# Patient Record
Sex: Male | Born: 1960 | Race: White | Hispanic: No | Marital: Single | Smoking: Current every day smoker
Health system: Southern US, Community
[De-identification: ages and names within clinical notes are randomized; demographics above are authoritative.]

## PROBLEM LIST (undated history)

## (undated) DIAGNOSIS — F1010 Alcohol abuse, uncomplicated: Secondary | ICD-10-CM

## (undated) DIAGNOSIS — M79606 Pain in leg, unspecified: Secondary | ICD-10-CM

## (undated) DIAGNOSIS — R0989 Other specified symptoms and signs involving the circulatory and respiratory systems: Secondary | ICD-10-CM

## (undated) DIAGNOSIS — G8929 Other chronic pain: Secondary | ICD-10-CM

## (undated) DIAGNOSIS — Z5900 Homelessness unspecified: Secondary | ICD-10-CM

## (undated) DIAGNOSIS — M549 Dorsalgia, unspecified: Secondary | ICD-10-CM

## (undated) HISTORY — PX: BACK SURGERY: SHX140

---

## 1996-03-19 HISTORY — PX: OTHER SURGICAL HISTORY: SHX169

## 2001-05-02 ENCOUNTER — Emergency Department (HOSPITAL_COMMUNITY): Admission: EM | Admit: 2001-05-02 | Discharge: 2001-05-02 | Payer: Self-pay | Admitting: *Deleted

## 2001-05-02 ENCOUNTER — Encounter: Payer: Self-pay | Admitting: *Deleted

## 2001-12-13 ENCOUNTER — Emergency Department (HOSPITAL_COMMUNITY): Admission: EM | Admit: 2001-12-13 | Discharge: 2001-12-14 | Payer: Self-pay | Admitting: Internal Medicine

## 2001-12-14 ENCOUNTER — Encounter: Payer: Self-pay | Admitting: Internal Medicine

## 2005-08-19 ENCOUNTER — Emergency Department (HOSPITAL_COMMUNITY): Admission: EM | Admit: 2005-08-19 | Discharge: 2005-08-19 | Payer: Self-pay | Admitting: Emergency Medicine

## 2005-08-28 ENCOUNTER — Emergency Department (HOSPITAL_COMMUNITY): Admission: EM | Admit: 2005-08-28 | Discharge: 2005-08-29 | Payer: Self-pay | Admitting: Emergency Medicine

## 2008-11-01 ENCOUNTER — Emergency Department (HOSPITAL_COMMUNITY): Admission: EM | Admit: 2008-11-01 | Discharge: 2008-11-01 | Payer: Self-pay | Admitting: Emergency Medicine

## 2008-11-02 ENCOUNTER — Emergency Department (HOSPITAL_COMMUNITY): Admission: EM | Admit: 2008-11-02 | Discharge: 2008-11-02 | Payer: Self-pay | Admitting: Emergency Medicine

## 2009-04-03 ENCOUNTER — Emergency Department (HOSPITAL_COMMUNITY): Admission: EM | Admit: 2009-04-03 | Discharge: 2009-04-03 | Payer: Self-pay | Admitting: Emergency Medicine

## 2009-04-08 ENCOUNTER — Emergency Department (HOSPITAL_COMMUNITY): Admission: EM | Admit: 2009-04-08 | Discharge: 2009-04-08 | Payer: Self-pay | Admitting: Emergency Medicine

## 2009-04-19 ENCOUNTER — Ambulatory Visit (HOSPITAL_COMMUNITY)
Admission: RE | Admit: 2009-04-19 | Discharge: 2009-04-19 | Payer: Self-pay | Admitting: Physical Medicine and Rehabilitation

## 2009-05-06 ENCOUNTER — Inpatient Hospital Stay (HOSPITAL_COMMUNITY)
Admission: RE | Admit: 2009-05-06 | Discharge: 2009-05-07 | Payer: Self-pay | Source: Home / Self Care | Admitting: Neurosurgery

## 2009-05-10 ENCOUNTER — Emergency Department (HOSPITAL_COMMUNITY): Admission: EM | Admit: 2009-05-10 | Discharge: 2009-05-10 | Payer: Self-pay | Admitting: Emergency Medicine

## 2009-06-09 ENCOUNTER — Encounter (HOSPITAL_COMMUNITY): Admission: RE | Admit: 2009-06-09 | Discharge: 2009-07-09 | Payer: Self-pay | Admitting: Neurosurgery

## 2009-06-14 ENCOUNTER — Emergency Department (HOSPITAL_COMMUNITY): Admission: EM | Admit: 2009-06-14 | Discharge: 2009-06-14 | Payer: Self-pay | Admitting: Emergency Medicine

## 2009-06-18 ENCOUNTER — Emergency Department (HOSPITAL_COMMUNITY): Admission: EM | Admit: 2009-06-18 | Discharge: 2009-06-19 | Payer: Self-pay | Admitting: Emergency Medicine

## 2009-06-25 ENCOUNTER — Emergency Department (HOSPITAL_COMMUNITY): Admission: EM | Admit: 2009-06-25 | Discharge: 2009-06-25 | Payer: Self-pay | Admitting: Emergency Medicine

## 2009-07-12 ENCOUNTER — Encounter (HOSPITAL_COMMUNITY)
Admission: RE | Admit: 2009-07-12 | Discharge: 2009-08-11 | Payer: Self-pay | Source: Home / Self Care | Admitting: Neurosurgery

## 2010-03-19 ENCOUNTER — Emergency Department (HOSPITAL_COMMUNITY)
Admission: EM | Admit: 2010-03-19 | Discharge: 2010-03-20 | Payer: Self-pay | Source: Home / Self Care | Admitting: Emergency Medicine

## 2010-03-27 ENCOUNTER — Emergency Department (HOSPITAL_COMMUNITY)
Admission: EM | Admit: 2010-03-27 | Discharge: 2010-03-27 | Payer: Self-pay | Source: Home / Self Care | Admitting: Emergency Medicine

## 2010-05-11 ENCOUNTER — Emergency Department (HOSPITAL_COMMUNITY)
Admission: EM | Admit: 2010-05-11 | Discharge: 2010-05-11 | Disposition: A | Discharge status: Home / Self Care | Payer: Self-pay | Attending: Emergency Medicine | Admitting: Emergency Medicine

## 2010-05-11 ENCOUNTER — Emergency Department (HOSPITAL_COMMUNITY): Payer: Self-pay

## 2010-05-11 DIAGNOSIS — F1010 Alcohol abuse, uncomplicated: Secondary | ICD-10-CM | POA: Insufficient documentation

## 2010-05-11 DIAGNOSIS — R4182 Altered mental status, unspecified: Secondary | ICD-10-CM | POA: Insufficient documentation

## 2010-05-11 LAB — CBC
HCT: 46.8 % (ref 39.0–52.0)
Hemoglobin: 16 g/dL (ref 13.0–17.0)
MCH: 33 pg (ref 26.0–34.0)
MCV: 96.5 fL (ref 78.0–100.0)

## 2010-05-11 LAB — COMPREHENSIVE METABOLIC PANEL
AST: 22 U/L (ref 0–37)
BUN: 10 mg/dL (ref 6–23)
CO2: 25 mEq/L (ref 19–32)
Chloride: 110 mEq/L (ref 96–112)
Creatinine, Ser: 1.1 mg/dL (ref 0.4–1.5)
GFR calc Af Amer: >60 mL/min (ref >60)
GFR calc non Af Amer: >60 mL/min (ref >60)
Glucose, Bld: 118 mg/dL — ABNORMAL HIGH (ref 70–99)
Sodium: 145 mEq/L (ref 135–145)
Total Bilirubin: 0.4 mg/dL (ref 0.3–1.2)

## 2010-05-11 LAB — POCT CARDIAC MARKERS
Myoglobin, poc: 95.4 ng/mL (ref 12–200)
Troponin i, poc: <0.05 ng/mL (ref 0.00–0.09)

## 2010-05-11 LAB — DIFFERENTIAL
Basophils Absolute: 0 10*3/uL (ref 0.0–0.1)
Eosinophils Relative: 2 % (ref 0–5)
Lymphocytes Relative: 20 % (ref 12–46)
Monocytes Relative: 7 % (ref 3–12)
Neutro Abs: 9.7 10*3/uL — ABNORMAL HIGH (ref 1.7–7.7)

## 2010-05-11 LAB — RAPID URINE DRUG SCREEN, HOSP PERFORMED
Amphetamines: NOT DETECTED
Barbiturates: NOT DETECTED
Opiates: NOT DETECTED

## 2010-05-11 LAB — CK TOTAL AND CKMB (NOT AT ARMC): CK, MB: 2.6 ng/mL (ref 0.3–4.0)

## 2010-05-15 LAB — POCT CARDIAC MARKERS
CKMB, poc: 1.3 ng/mL (ref 1.0–8.0)
Myoglobin, poc: 244 ng/mL (ref 12–200)
Myoglobin, poc: 67.6 ng/mL (ref 12–200)
Troponin i, poc: <0.05 ng/mL (ref 0.00–0.09)
Troponin i, poc: <0.05 ng/mL (ref 0.00–0.09)

## 2010-06-07 LAB — DIFFERENTIAL
Basophils Absolute: 0 10*3/uL (ref 0.0–0.1)
Basophils Relative: 0 % (ref 0–1)
Lymphocytes Relative: 19 % (ref 12–46)
Monocytes Absolute: 0.7 10*3/uL (ref 0.1–1.0)
Neutro Abs: 7.1 10*3/uL (ref 1.7–7.7)
Neutrophils Relative %: 72 % (ref 43–77)

## 2010-06-07 LAB — POCT CARDIAC MARKERS
CKMB, poc: 2.7 ng/mL (ref 1.0–8.0)
Myoglobin, poc: 89.6 ng/mL (ref 12–200)

## 2010-06-07 LAB — COMPREHENSIVE METABOLIC PANEL
Albumin: 3.3 g/dL — ABNORMAL LOW (ref 3.5–5.2)
Alkaline Phosphatase: 110 U/L (ref 39–117)
BUN: 4 mg/dL — ABNORMAL LOW (ref 6–23)
Chloride: 103 mEq/L (ref 96–112)
Creatinine, Ser: 0.78 mg/dL (ref 0.4–1.5)
Glucose, Bld: 117 mg/dL — ABNORMAL HIGH (ref 70–99)
Total Bilirubin: 0.2 mg/dL — ABNORMAL LOW (ref 0.3–1.2)

## 2010-06-07 LAB — CBC
HCT: 38.7 % — ABNORMAL LOW (ref 39.0–52.0)
Hemoglobin: 13.4 g/dL (ref 13.0–17.0)
MCV: 97.3 fL (ref 78.0–100.0)
Platelets: 358 10*3/uL (ref 150–400)
RDW: 13.4 % (ref 11.5–15.5)
WBC: 9.9 10*3/uL (ref 4.0–10.5)

## 2010-06-07 LAB — RAPID URINE DRUG SCREEN, HOSP PERFORMED
Amphetamines: NOT DETECTED
Tetrahydrocannabinol: NOT DETECTED

## 2010-06-07 LAB — AMMONIA: Ammonia: 17 umol/L (ref 11–35)

## 2010-06-08 LAB — CBC
HCT: 42.9 % (ref 39.0–52.0)
Hemoglobin: 14.8 g/dL (ref 13.0–17.0)
RBC: 4.3 MIL/uL (ref 4.22–5.81)
RDW: 12.7 % (ref 11.5–15.5)
WBC: 12 10*3/uL — ABNORMAL HIGH (ref 4.0–10.5)

## 2010-06-08 LAB — COMPREHENSIVE METABOLIC PANEL
ALT: 28 U/L (ref 0–53)
Alkaline Phosphatase: 97 U/L (ref 39–117)
BUN: 12 mg/dL (ref 6–23)
Chloride: 103 mEq/L (ref 96–112)
Glucose, Bld: 100 mg/dL — ABNORMAL HIGH (ref 70–99)
Potassium: 4.1 mEq/L (ref 3.5–5.1)
Sodium: 137 mEq/L (ref 135–145)
Total Bilirubin: 1.3 mg/dL — ABNORMAL HIGH (ref 0.3–1.2)
Total Protein: 7.4 g/dL (ref 6.0–8.3)

## 2010-06-08 LAB — SURGICAL PCR SCREEN
MRSA, PCR: NEGATIVE
Staphylococcus aureus: NEGATIVE

## 2010-06-24 LAB — PROTIME-INR
INR: 0.9 (ref 0.00–1.49)
Prothrombin Time: 12.4 seconds (ref 11.6–15.2)

## 2010-06-24 LAB — RAPID URINE DRUG SCREEN, HOSP PERFORMED
Benzodiazepines: NOT DETECTED
Cocaine: NOT DETECTED
Tetrahydrocannabinol: NOT DETECTED

## 2010-06-24 LAB — CBC
Platelets: 257 10*3/uL (ref 150–400)
RBC: 4.4 MIL/uL (ref 4.22–5.81)
WBC: 8.5 10*3/uL (ref 4.0–10.5)

## 2010-06-24 LAB — BASIC METABOLIC PANEL
BUN: 8 mg/dL (ref 6–23)
Creatinine, Ser: 0.69 mg/dL (ref 0.4–1.5)
GFR calc Af Amer: >60 mL/min (ref >60)
GFR calc non Af Amer: >60 mL/min (ref >60)
Potassium: 3.8 mEq/L (ref 3.5–5.1)

## 2010-06-24 LAB — POCT CARDIAC MARKERS
CKMB, poc: 1 ng/mL (ref 1.0–8.0)
Myoglobin, poc: 54.7 ng/mL (ref 12–200)

## 2010-06-24 LAB — DIFFERENTIAL
Lymphocytes Relative: 31 % (ref 12–46)
Lymphs Abs: 2.7 10*3/uL (ref 0.7–4.0)
Monocytes Relative: 9 % (ref 3–12)
Neutrophils Relative %: 54 % (ref 43–77)

## 2010-06-24 LAB — ETHANOL: Alcohol, Ethyl (B): 320 mg/dL — ABNORMAL HIGH (ref 0–10)

## 2011-04-12 IMAGING — CT CT HEAD W/O CM
1 of 2 series · 16 of 30 positions shown, 20 images · non-contrast
Comparison: 06/25/2009

CLINICAL DATA: Head trauma, left sided scalp laceration.

CT HEAD WITHOUT CONTRAST
TECHNIQUE: Contiguous axial images were obtained from the base of
the skull through the vertex without contrast.

[Series 3: headtrauma 2.4 h60s · axial · 0.43mm/px · z∈[+955,+1115]mm · 16 of 72 slices shown, 20 images]
[im 4/72  brain]
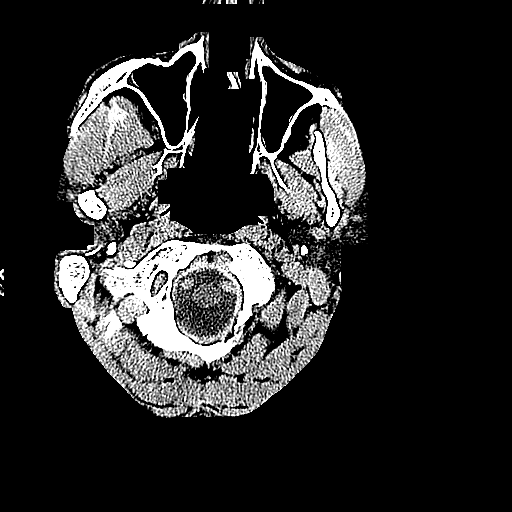
[im 4/72  bone]
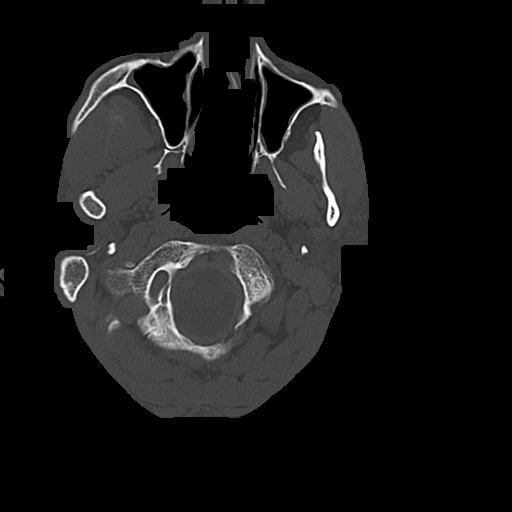
[im 8/72  brain]
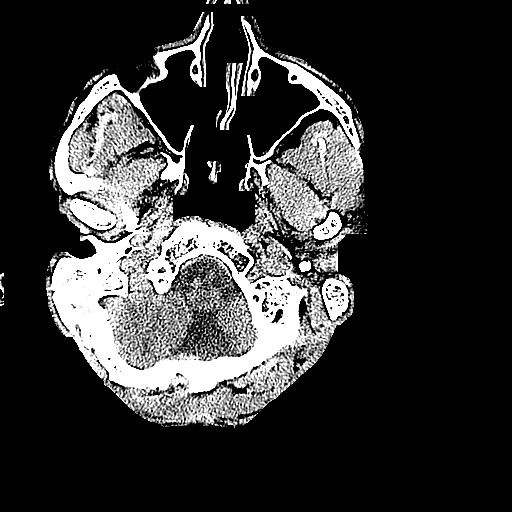
[im 12/72  brain]
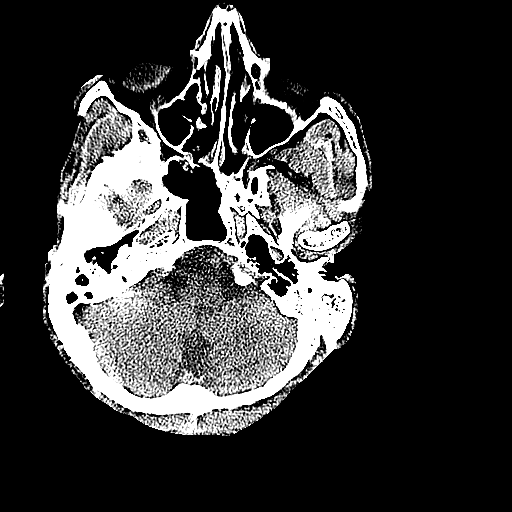
[im 15/72  brain]
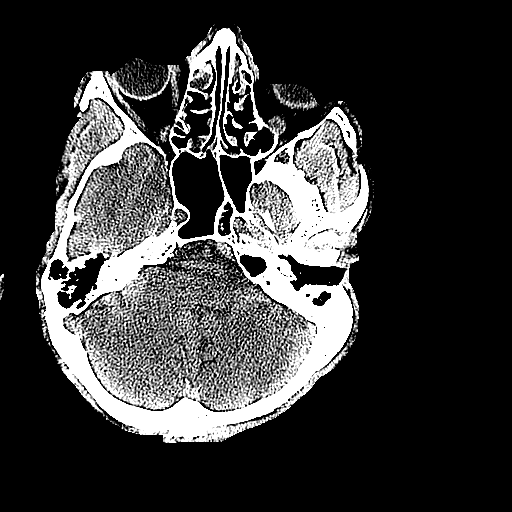
[im 23/72  brain]
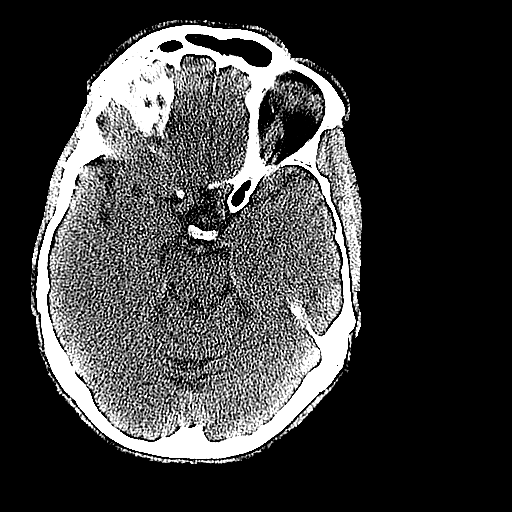
[im 23/72  bone]
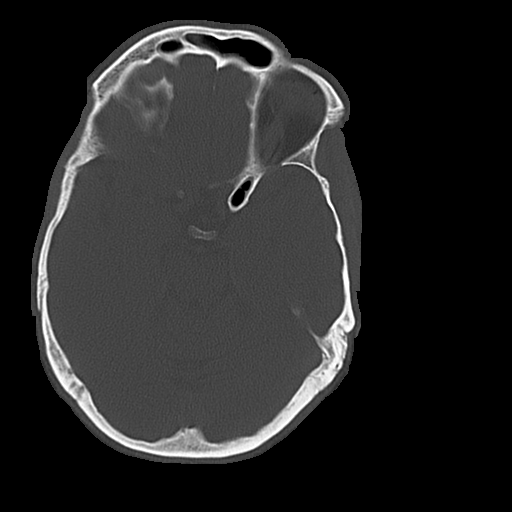
[im 27/72  brain]
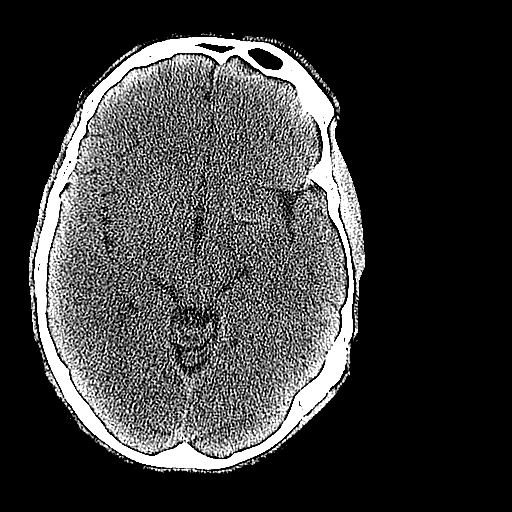
[im 30/72  brain]
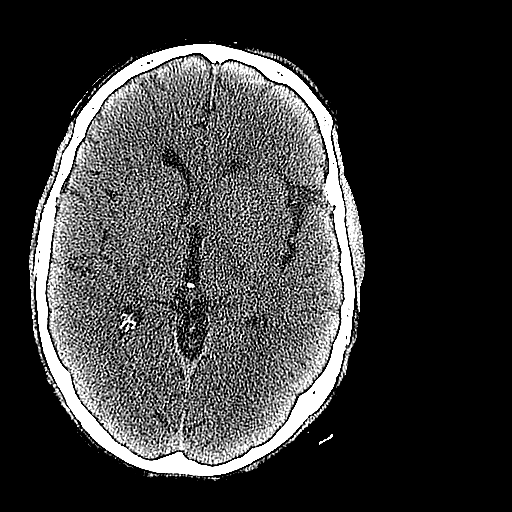
[im 34/72  brain]
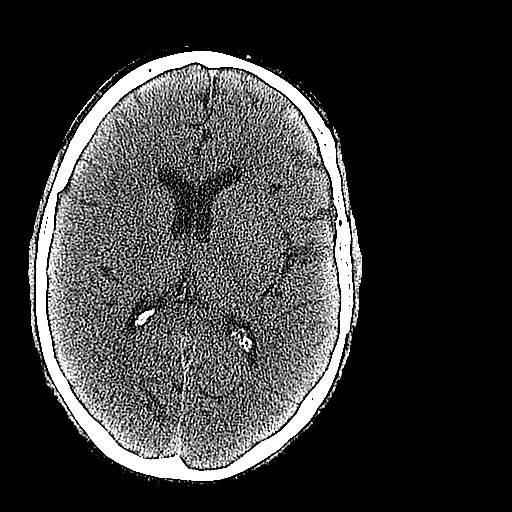
[im 38/72  brain]
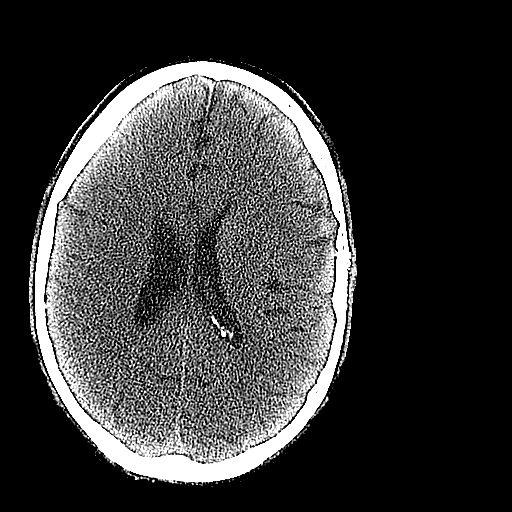
[im 38/72  bone]
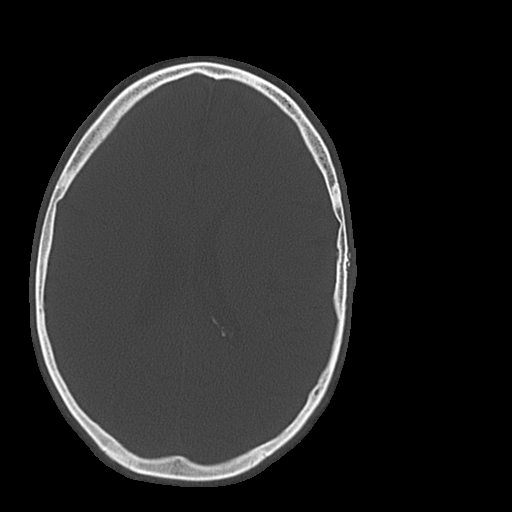
[im 42/72  brain]
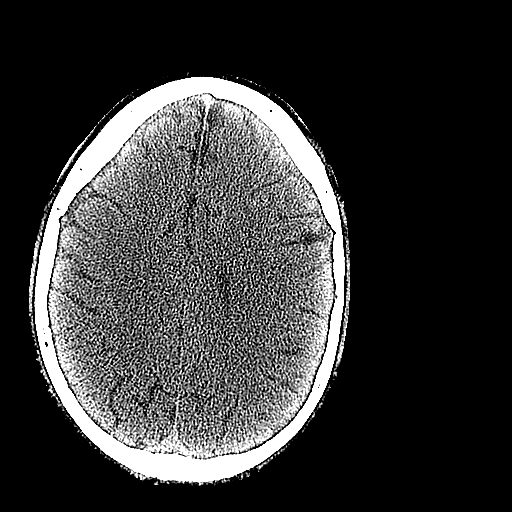
[im 45/72  brain]
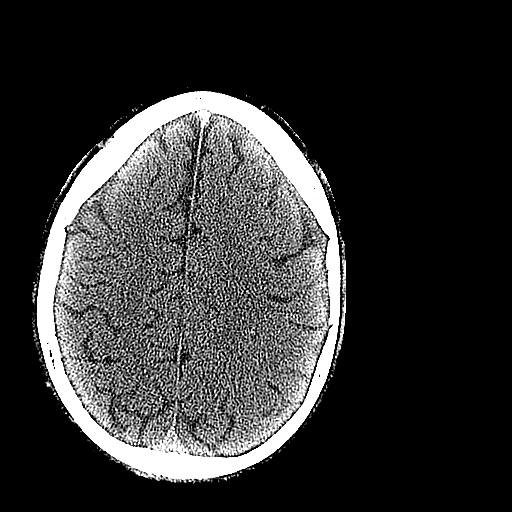
[im 49/72  brain]
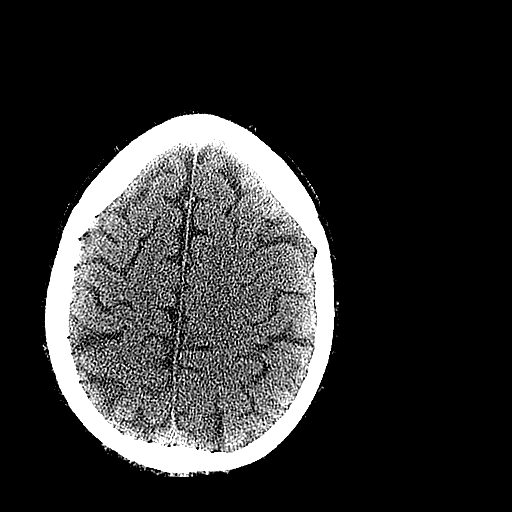
[im 57/72  brain]
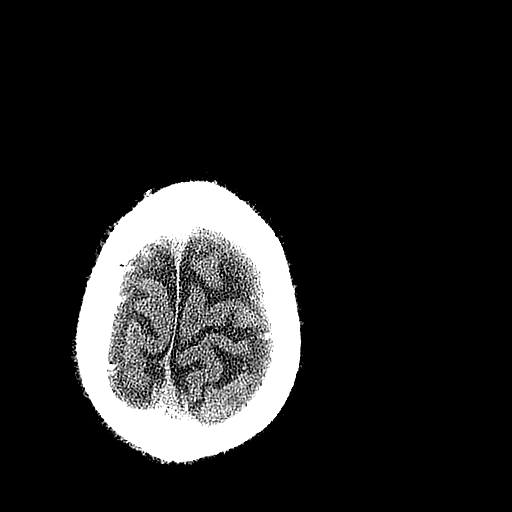
[im 57/72  bone]
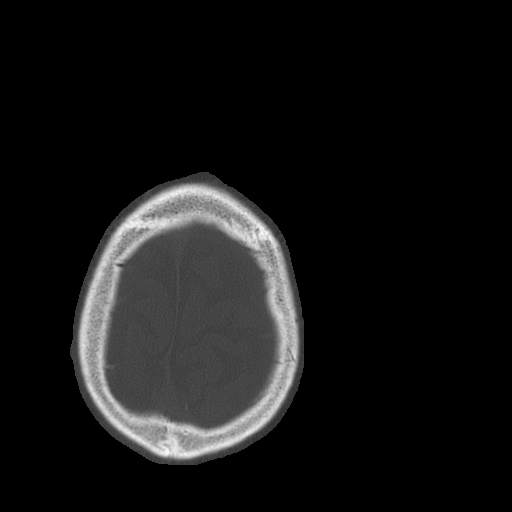
[im 60/72  brain]
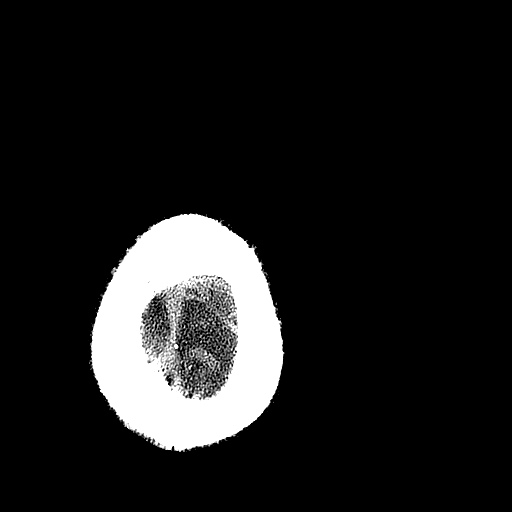
[im 64/72  brain]
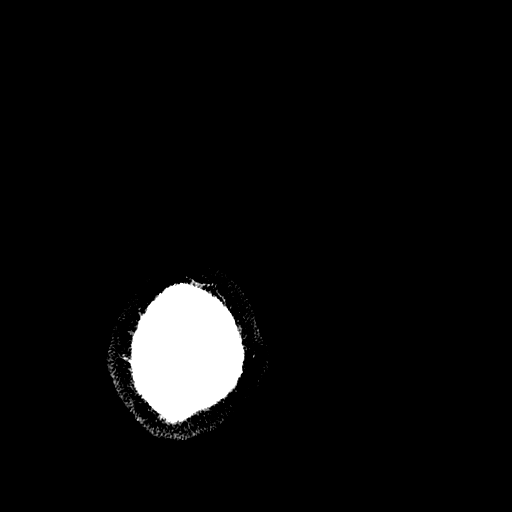
[im 68/72  brain]
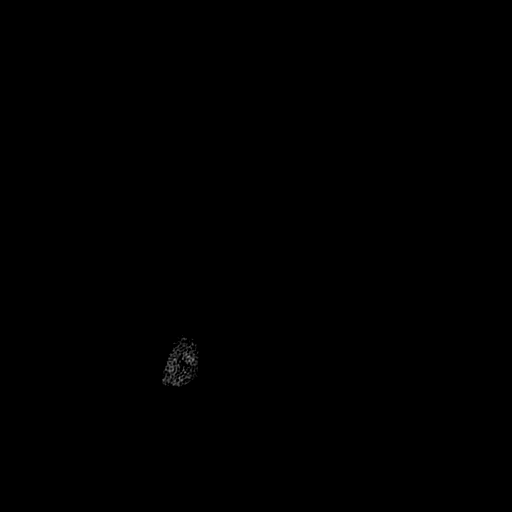

[16 of 30 positions shown; findings below may reference images not displayed]

FINDINGS: Pansinusitis noted.  No skull fracture.  Soft tissue
laceration overlying the left frontal scalp noted. No acute
hemorrhage, acute infarction, or mass lesion is seen.
IMPRESSION: No acute intracranial finding.  Pansinusitis.

## 2011-05-14 ENCOUNTER — Encounter (HOSPITAL_COMMUNITY): Payer: Self-pay | Admitting: *Deleted

## 2011-05-14 ENCOUNTER — Emergency Department (HOSPITAL_COMMUNITY)
Admission: EM | Admit: 2011-05-14 | Discharge: 2011-05-15 | Disposition: A | Discharge status: Home / Self Care | Payer: Self-pay | Attending: Emergency Medicine | Admitting: Emergency Medicine

## 2011-05-14 DIAGNOSIS — K047 Periapical abscess without sinus: Secondary | ICD-10-CM | POA: Insufficient documentation

## 2011-05-14 DIAGNOSIS — F17200 Nicotine dependence, unspecified, uncomplicated: Secondary | ICD-10-CM | POA: Insufficient documentation

## 2011-05-14 DIAGNOSIS — K089 Disorder of teeth and supporting structures, unspecified: Secondary | ICD-10-CM | POA: Insufficient documentation

## 2011-05-14 DIAGNOSIS — R220 Localized swelling, mass and lump, head: Secondary | ICD-10-CM | POA: Insufficient documentation

## 2011-05-14 DIAGNOSIS — R6884 Jaw pain: Secondary | ICD-10-CM | POA: Insufficient documentation

## 2011-05-14 DIAGNOSIS — K029 Dental caries, unspecified: Secondary | ICD-10-CM | POA: Insufficient documentation

## 2011-05-14 DIAGNOSIS — R221 Localized swelling, mass and lump, neck: Secondary | ICD-10-CM | POA: Insufficient documentation

## 2011-05-14 HISTORY — DX: Other specified symptoms and signs involving the circulatory and respiratory systems: R09.89

## 2011-05-14 MED ORDER — ONDANSETRON HCL 4 MG/2ML IJ SOLN
4.0000 mg | Freq: Once | INTRAMUSCULAR | Status: AC
  Administered 2011-05-15: 4 mg via INTRAVENOUS
  Filled 2011-05-14: qty 2

## 2011-05-14 MED ORDER — DEXTROSE 5 % IV SOLN: 900.0000 mg | Freq: Once | INTRAVENOUS | Status: AC

## 2011-05-14 MED ORDER — HYDROMORPHONE HCL PF 1 MG/ML IJ SOLN
1.0000 mg | Freq: Once | INTRAMUSCULAR | Status: AC
  Administered 2011-05-15: 1 mg via INTRAVENOUS
  Filled 2011-05-14: qty 1

## 2011-05-14 NOTE — ED Notes (Signed)
Denies difficultly breathing but admits to trouble swallowing at times due to swelling and talking

## 2011-05-14 NOTE — ED Notes (Signed)
Swelling noted left jaw due to dental problems, dental problems for 2 months

## 2011-05-15 ENCOUNTER — Encounter (HOSPITAL_COMMUNITY): Payer: Self-pay | Admitting: Emergency Medicine

## 2011-05-15 MED ORDER — CLINDAMYCIN PHOSPHATE 900 MG/50ML IV SOLN
INTRAVENOUS | Status: AC
Start: 2011-05-15 — End: 2011-05-15
  Administered 2011-05-15: 900 mg
  Filled 2011-05-15: qty 50

## 2011-05-15 MED ORDER — AMOXICILLIN 500 MG PO CAPS: 500.0000 mg | ORAL_CAPSULE | Freq: Three times a day (TID) | ORAL | Status: AC

## 2011-05-15 MED ORDER — OXYCODONE-ACETAMINOPHEN 5-325 MG PO TABS: 1.0000 | ORAL_TABLET | ORAL | Status: AC | PRN

## 2011-05-15 NOTE — ED Provider Notes (Signed)
History     CSN: [id number]  Arrival date & time 05/14/11  1811   First MD Initiated Contact with Patient 05/14/11 2326      Chief Complaint  Patient presents with  . Facial Swelling    (Consider location/radiation/quality/duration/timing/severity/associated sxs/prior treatment) HPI Comments: Patient c/o increasing dental pain for 2 months.  States today, the pain became worse and he noticed swelling of his left face and jaw that started around noon today.  He states he had similar problems with the same teeth that also resulted in facial swelling but symptoms resolved.  He also states that he does not have a dentist at present.    Patient is a 51 y.o. male presenting with tooth pain. The history is provided by the patient. No language interpreter was used.  Dental PainThe primary symptoms include mouth pain. Primary symptoms do not include dental injury, oral bleeding, oral lesions, headaches, fever, shortness of breath, sore throat, angioedema or cough. The symptoms began more than 1 month ago (2 months). The symptoms are worsening. The symptoms are recurrent. The symptoms occur constantly.  Mouth pain began more than 1 month ago. Mouth pain occurs constantly. Mouth pain is worsening. Affected locations include: teeth and gum(s). The mouth pain is currently at 10/10.  Additional symptoms include: dental sensitivity to temperature, gum swelling, gum tenderness, jaw pain and facial swelling. Additional symptoms do not include: purulent gums, trismus, trouble swallowing, pain with swallowing, excessive salivation, ear pain and swollen glands. Medical issues include: smoking and periodontal disease.    Past Medical History  Diagnosis Date  . Back pain   . Poor circulation of extremity     Past Surgical History  Procedure Date  . Back surgery     History reviewed. No pertinent family history.  History  Substance Use Topics  . Smoking status: Current Everyday Smoker -- 2.0 packs/day     Types: Cigarettes  . Smokeless tobacco: Not on file  . Alcohol Use: No     quit over a month ago      Review of Systems  Constitutional: Negative for fever.  HENT: Positive for facial swelling and dental problem. Negative for ear pain, sore throat and trouble swallowing.   Respiratory: Negative for cough and shortness of breath.   Gastrointestinal: Negative for nausea and vomiting.  Skin: Negative.   Neurological: Negative for dizziness, numbness and headaches.  Hematological: Negative for adenopathy.  All other systems reviewed and are negative.    Allergies  Review of patient's allergies indicates not on file.  Home Medications  No current outpatient prescriptions on file.  BP 161/99  Pulse 111  Temp(Src) 98.3 F (36.8 C) (Oral)  Resp 20  Ht 5\' 11"  (1.803 m)  Wt 174 lb (78.926 kg)  BMI 24.27 kg/m2  SpO2 100%  Physical Exam  Nursing note and vitals reviewed. Constitutional: He is oriented to person, place, and time. He appears well-developed and well-nourished. No distress.  HENT:  Head: Normocephalic and atraumatic. No trismus in the jaw.  Right Ear: Tympanic membrane and ear canal normal.  Left Ear: Tympanic membrane and ear canal normal.  Mouth/Throat: Uvula is midline, oropharynx is clear and moist and mucous membranes are normal. Dental abscesses and dental caries present. No uvula swelling.    Eyes: EOM are normal. Pupils are equal, round, and reactive to light.  Neck: Normal range of motion. Neck supple.  Cardiovascular: Normal rate, regular rhythm and normal heart sounds.   No murmur heard. Pulmonary/Chest: Effort  normal and breath sounds normal. No respiratory distress.  Musculoskeletal: Normal range of motion. He exhibits no edema and no tenderness.  Lymphadenopathy:    He has no cervical adenopathy.  Neurological: He is alert and oriented to person, place, and time. No cranial nerve deficit. He exhibits normal muscle tone. Coordination normal.    Skin: Skin is warm and dry.    ED Course  Procedures (including critical care time)       MDM     Mild to moderate STS of the left lower jaw.  Pt has ttp of the left lower second and third molars and surrounding gums with multiple dental caries of several teeth on the left. No obvious fluctuance of the gums.  No trismus.  Likely dental abscess. Patient handles own secretions well and speaking w/o difficulty.  He agrees to close f/u with a dentist.  I will give him the dental referral info which includes the dentist at the health dept  Pt feels improved after observation and/or treatment in ED. Patient / Family / Caregiver understand and agree with initial ED impression and plan with expectations set for ED visit. Pt stable in ED with no significant deterioration in condition.        Krisalyn Yankowski L. [place], [place] 05/15/11 [phone]

## 2011-05-15 NOTE — ED Provider Notes (Signed)
Medical screening examination/treatment/procedure(s) were performed by non-physician practitioner and as supervising physician I was immediately available for consultation/collaboration.   Benny Lennert, MD 05/15/11 [phone]

## 2012-07-26 ENCOUNTER — Emergency Department (HOSPITAL_COMMUNITY): Payer: Self-pay

## 2012-07-26 ENCOUNTER — Emergency Department (HOSPITAL_COMMUNITY)
Admission: EM | Admit: 2012-07-26 | Discharge: 2012-07-26 | Disposition: A | Discharge status: Home / Self Care | Payer: Self-pay | Attending: Emergency Medicine | Admitting: Emergency Medicine

## 2012-07-26 ENCOUNTER — Encounter (HOSPITAL_COMMUNITY): Payer: Self-pay | Admitting: *Deleted

## 2012-07-26 DIAGNOSIS — Z8679 Personal history of other diseases of the circulatory system: Secondary | ICD-10-CM | POA: Insufficient documentation

## 2012-07-26 DIAGNOSIS — R079 Chest pain, unspecified: Secondary | ICD-10-CM | POA: Insufficient documentation

## 2012-07-26 DIAGNOSIS — F17200 Nicotine dependence, unspecified, uncomplicated: Secondary | ICD-10-CM | POA: Insufficient documentation

## 2012-07-26 DIAGNOSIS — R0602 Shortness of breath: Secondary | ICD-10-CM | POA: Insufficient documentation

## 2012-07-26 LAB — URINALYSIS, ROUTINE W REFLEX MICROSCOPIC
Bilirubin Urine: NEGATIVE
Hgb urine dipstick: NEGATIVE
Nitrite: NEGATIVE
Specific Gravity, Urine: <1.005 — ABNORMAL LOW (ref 1.005–1.030)
pH: 6 (ref 5.0–8.0)

## 2012-07-26 LAB — CBC
MCH: 35.2 pg — ABNORMAL HIGH (ref 26.0–34.0)
MCHC: 35.6 g/dL (ref 30.0–36.0)
Platelets: 170 10*3/uL (ref 150–400)
RDW: 12.7 % (ref 11.5–15.5)

## 2012-07-26 LAB — BASIC METABOLIC PANEL
Calcium: 9.1 mg/dL (ref 8.4–10.5)
GFR calc Af Amer: >90 mL/min (ref >90)
GFR calc non Af Amer: >90 mL/min (ref >90)
Glucose, Bld: 87 mg/dL (ref 70–99)
Sodium: 142 mEq/L (ref 135–145)

## 2012-07-26 LAB — RAPID URINE DRUG SCREEN, HOSP PERFORMED
Cocaine: NOT DETECTED
Opiates: NOT DETECTED
Tetrahydrocannabinol: NOT DETECTED

## 2012-07-26 MED ORDER — NITROGLYCERIN 0.4 MG SL SUBL
0.4000 mg | SUBLINGUAL_TABLET | SUBLINGUAL | Status: AC | PRN
  Administered 2012-07-26 (×3): 0.4 mg via SUBLINGUAL

## 2012-07-26 MED ORDER — ASPIRIN 81 MG PO CHEW
324.0000 mg | CHEWABLE_TABLET | Freq: Once | ORAL | Status: AC
  Administered 2012-07-26: 324 mg via ORAL
  Filled 2012-07-26: qty 4

## 2012-07-26 MED ORDER — RANITIDINE HCL 150 MG PO TABS: 150.0000 mg | ORAL_TABLET | Freq: Two times a day (BID) | ORAL | Status: DC

## 2012-07-26 NOTE — ED Provider Notes (Addendum)
History  This chart was scribed for Gilda Crease, MD by Jiles Prows, ED Scribe. The patient was seen in room APA10/APA10 and the patient's care was started at 5:02 PM.   CSN: [id number]  Arrival date & time 07/26/12  1600   Chief Complaint  Patient presents with  . Chest Pain    The history is provided by the patient and medical records. No language interpreter was used.   HPI Comments: Matthew Brady is a 52 y.o. male who presents to the Emergency Department complaining of intermittent moderate chest pain and SOB that began yesterday.  He states that this is not a new problem, but he has never received medical treatment for this issue.  Pt denies headache, diaphoresis, fever, chills, nausea, vomiting, diarrhea, weakness, cough, SOB and any other pain.   Per nursing triage note, he admitted to drinking 5-6 beers today.  Past Medical History  Diagnosis Date  . Back pain   . Poor circulation of extremity     Past Surgical History  Procedure Laterality Date  . Back surgery      No family history on file.  History  Substance Use Topics  . Smoking status: Current Every Day Smoker -- 2.00 packs/day    Types: Cigarettes  . Smokeless tobacco: Not on file  . Alcohol Use: No     Comment: quit over a month ago      Review of Systems  Constitutional: Negative for fever and diaphoresis.  Respiratory: Positive for shortness of breath. Negative for cough.   Cardiovascular: Positive for chest pain.  Gastrointestinal: Negative for nausea, vomiting and abdominal pain.  All other systems reviewed and are negative.    Allergies  Review of patient's allergies indicates no known allergies.  Home Medications  No current outpatient prescriptions on file.  BP 143/94  Pulse 86  Temp(Src) 97.6 F (36.4 C) (Oral)  Resp 20  Ht 5\' 9"  (1.753 m)  Wt 168 lb (76.204 kg)  BMI 24.8 kg/m2  SpO2 95%  Physical Exam  Constitutional: He is oriented to person, place, and time. He  appears well-developed and well-nourished. No distress.  HENT:  Head: Normocephalic and atraumatic.  Right Ear: Hearing normal.  Left Ear: Hearing normal.  Nose: Nose normal.  Mouth/Throat: Oropharynx is clear and moist and mucous membranes are normal.  Eyes: Conjunctivae and EOM are normal. Pupils are equal, round, and reactive to light.  Neck: Normal range of motion. Neck supple.  Cardiovascular: Regular rhythm, S1 normal and S2 normal.  Exam reveals no gallop and no friction rub.   No murmur heard. Pulmonary/Chest: Effort normal and breath sounds normal. No respiratory distress. He exhibits no tenderness.  Abdominal: Soft. Normal appearance and bowel sounds are normal. There is no hepatosplenomegaly. There is no tenderness. There is no rebound, no guarding, no tenderness at McBurney's point and negative Murphy's sign. No hernia.  Musculoskeletal: Normal range of motion.  Neurological: He is alert and oriented to person, place, and time. He has normal strength. No cranial nerve deficit or sensory deficit. Coordination normal. GCS eye subscore is 4. GCS verbal subscore is 5. GCS motor subscore is 6.  Skin: Skin is warm, dry and intact. No rash noted. No cyanosis.  Psychiatric: He has a normal mood and affect. His speech is normal and behavior is normal. Thought content normal.    ED Course  Procedures (including critical care time) DIAGNOSTIC STUDIES: Oxygen Saturation is 95% on RA, low by my interpretation.  EKG:  Date: 07/26/2012  Rate: 84  Rhythm: normal sinus rhythm  QRS Axis: normal  Intervals: normal  ST/T Wave abnormalities: normal  Conduction Disutrbances: none  Narrative Interpretation: unremarkable      COORDINATION OF CARE: 5:04 PM - Discussed ED treatment with pt at bedside including EKG and blood work and pt agrees.   Labs Reviewed  CBC - Abnormal; Notable for the following:    MCH 35.2 (*)    All other components within normal limits  BASIC METABOLIC  PANEL - Abnormal; Notable for the following:    BUN 5 (*)    All other components within normal limits  URINALYSIS, ROUTINE W REFLEX MICROSCOPIC - Abnormal; Notable for the following:    Color, Urine STRAW (*)    Specific Gravity, Urine <1.005 (*)    All other components within normal limits  LIPASE, BLOOD - Abnormal; Notable for the following:    Lipase 121 (*)    All other components within normal limits  ETHANOL - Abnormal; Notable for the following:    Alcohol, Ethyl (B) 319 (*)    All other components within normal limits  TROPONIN I  URINE RAPID DRUG SCREEN (HOSP PERFORMED)  PRO B NATRIURETIC PEPTIDE   Dg Chest 2 View  07/26/2012  *RADIOLOGY REPORT*  Clinical Data:  Chest pain off and on for 3 days, shortness of breath, smoker  CHEST - 2 VIEW  Comparison: 05/11/2010  Findings: Normal heart size, mediastinal contours, and pulmonary vascularity. Lungs appear emphysematous with mild central peribronchial thickening. No acute infiltrate, pleural effusion or pneumothorax. Old healed clavicular fractures. No acute osseous abnormalities.  IMPRESSION: COPD changes. No acute abnormalities.   Original Report Authenticated By: Ulyses Southward, M.D.      Diagnosis: Chest pain unclear etiology    MDM  Patient presents to the ER for evaluation of chest pain. He reports that it started yesterday and has been present intermittently. Patient reports that comes and goes, is not identified any clear exacerbating factors. Patient's EKG was normal. His troponin was negative. This is reassuring after the pain for more than one day, but does not rule out cardiac disease. I discussed this with the patient and his significant other. I informed him that he would need to be hospitalized overnight to rule out heart attack and prevent further deterioration, possibly even death. He expresses understanding of the situation but does not wish to be hospitalized. His significant other supports his decision.  Patient  does admit to drinking alcohol today. His alcohol level was very elevated. He drinks daily I suspect that his alcohol level was around 300 at all times. It has been a copy of the test here in the ER. Although he has been drinking and his alcohol level is quite elevated, he is alert, oriented and does not have any outward signs of intoxication. This is likely because of his chronic intoxication. Patient cannot be convinced to stay in the emergency department. He would take 10-12 hours to become sober and he'll not stay that long in the ER to be able to have him reconsider when not intoxicated. Patient wants to be discharged now and I cannot stop hi from leaving the ER.  Patient does have a slightly elevated lipase. This might be the cause of the patient's symptoms. Lipase is only slightly elevated and would not require hospitalization. Other GI causes are also considered, including alcoholic gastritis.  Patient will be put on an 81 mg aspirin daily for cardioprotective effect. Will  add Zantac to cover for GI causes of his pain.   I personally performed the services described in this documentation, which was scribed in my presence. The recorded information has been reviewed and is accurate.    Gilda Crease, MD 07/26/12 1820  Gilda Crease, MD 07/26/12 [phone]

## 2012-07-26 NOTE — ED Notes (Signed)
Chest pain began yesterday evening. EMS was called to residence and pt refused to come in last night. Pt drank 5-6 beers today.

## 2013-10-10 ENCOUNTER — Emergency Department (HOSPITAL_COMMUNITY)
Admission: EM | Admit: 2013-10-10 | Discharge: 2013-10-10 | Disposition: A | Discharge status: Home / Self Care | Payer: Self-pay | Attending: Emergency Medicine | Admitting: Emergency Medicine

## 2013-10-10 ENCOUNTER — Encounter (HOSPITAL_COMMUNITY): Payer: Self-pay | Admitting: Emergency Medicine

## 2013-10-10 DIAGNOSIS — A630 Anogenital (venereal) warts: Secondary | ICD-10-CM | POA: Insufficient documentation

## 2013-10-10 DIAGNOSIS — Z8739 Personal history of other diseases of the musculoskeletal system and connective tissue: Secondary | ICD-10-CM | POA: Insufficient documentation

## 2013-10-10 DIAGNOSIS — Z79899 Other long term (current) drug therapy: Secondary | ICD-10-CM | POA: Insufficient documentation

## 2013-10-10 DIAGNOSIS — B86 Scabies: Secondary | ICD-10-CM | POA: Insufficient documentation

## 2013-10-10 DIAGNOSIS — F17200 Nicotine dependence, unspecified, uncomplicated: Secondary | ICD-10-CM | POA: Insufficient documentation

## 2013-10-10 DIAGNOSIS — R21 Rash and other nonspecific skin eruption: Secondary | ICD-10-CM | POA: Insufficient documentation

## 2013-10-10 DIAGNOSIS — Z8679 Personal history of other diseases of the circulatory system: Secondary | ICD-10-CM | POA: Insufficient documentation

## 2013-10-10 MED ORDER — PERMETHRIN 5 % EX CREA: TOPICAL_CREAM | CUTANEOUS | Status: DC

## 2013-10-10 NOTE — ED Provider Notes (Signed)
CSN: [id number]     Arrival date & time 10/10/13  1613 History   First MD Initiated Contact with Patient 10/10/13 1641     Chief Complaint  Patient presents with  . Rash     (Consider location/radiation/quality/duration/timing/severity/associated sxs/prior Treatment) Patient is a 53 y.o. male presenting with rash. The history is provided by the patient.  Rash Location:  Shoulder/arm and ano-genital Shoulder/arm rash location:  L arm and R arm Ano-genital rash location:  L buttock and R buttock Severity:  Moderate Onset quality:  Gradual Duration:  2 weeks Timing:  Constant Progression:  Worsening Chronicity:  New Relieved by:  Nothing Worsened by:  Heat Ineffective treatments:  Antibiotic cream Matthew Brady is a 53 y.o. male who presents to the ED with a rash to bilateral arms and buttocks x 2 weeks. He states he has used antibiotic ointment without relief. He has not taken anything by mouth for the itching. He reports living in a shed. He does have a girl friend that he has sex with but denies male partner or anal intercourse.   Past Medical History  Diagnosis Date  . Back pain   . Poor circulation of extremity    Past Surgical History  Procedure Laterality Date  . Back surgery     History reviewed. No pertinent family history. History  Substance Use Topics  . Smoking status: Current Every Day Smoker -- 2.00 packs/day    Types: Cigarettes  . Smokeless tobacco: Not on file  . Alcohol Use: No     Comment: quit over a month ago    Review of Systems  Gastrointestinal:       Anal itching  Skin: Positive for rash.  All other systems negative    Allergies  Review of patient's allergies indicates no known allergies.  Home Medications   Prior to Admission medications   Medication Sig Start Date End Date Taking? Authorizing Provider  ranitidine (ZANTAC) 150 MG tablet Take 1 tablet (150 mg total) by mouth 2 (two) times daily. 07/26/12   Gilda Creasehristopher J. Pollina, MD    BP 123/85  Pulse 105  Temp(Src) 98.9 F (37.2 C) (Oral)  Resp 18  SpO2 99% Physical Exam  Nursing note and vitals reviewed. Constitutional: He is oriented to person, place, and time. He appears well-developed and well-nourished.  HENT:  Head: Normocephalic and atraumatic.  Eyes: Conjunctivae and EOM are normal.  Neck: Neck supple.  Cardiovascular: Normal rate.   Pulmonary/Chest: Effort normal.  Genitourinary:  Multiple condyloma noted around the anus. There is stool around the anus and dried stool on the scrotum.   Musculoskeletal: Normal range of motion.  Bilateral forearms, elbows and hands (between fingers) with rash that appears as scabies.   Neurological: He is alert and oriented to person, place, and time. No cranial nerve deficit.  Skin: Skin is warm and dry.  Psychiatric: He has a normal mood and affect. His behavior is normal.    ED Course  Procedures  Dr. Adriana Simasook in to examine the patient.  MDM  53 y.o. male with rash to forearms, elbows and hands. Will treat for scabies. Also anal itching with multiple condyloma noted. He will follow up with the health department for treatment. Stable for discharge. Discussed with the patient and all questioned fully answered. He agrees with plan.      [facility] Orlene OchM [place], [place] 10/10/13 1740

## 2013-10-10 NOTE — ED Notes (Signed)
Rash to both arms.  Was on antibiotics two weeks ago.  C/o Buttock itching.

## 2013-10-10 NOTE — Discharge Instructions (Signed)
The rash on your arms is from scabies. Use the cream as directed.   The itching and areas on your buttock near your anus are due to warts. Your will need to follow up with the health department for treatment of the warts.   Genital Warts Genital warts are a sexually transmitted infection. They may appear as small bumps on the tissues of the genital area. CAUSES  Genital warts are caused by a virus called human papillomavirus (HPV). HPV is the most common sexually transmitted disease (STD) and infection of the sex organs. This infection is spread by having unprotected sex with an infected person. It can be spread by vaginal, anal, and oral sex. Many people do not know they are infected. They may be infected for years without problems. However, even if they do not have problems, they can unknowingly pass the infection to their sexual partners. SYMPTOMS   Itching and irritation in the genital area.  Warts that bleed.  Painful sexual intercourse. DIAGNOSIS  Warts are usually recognized with the naked eye on the vagina, vulva, perineum, anus, and rectum. Certain tests can also diagnose genital warts, such as:  A Pap test.  A tissue sample (biopsy) exam.  Colposcopy. A magnifying tool is used to examine the vagina and cervix. The HPV cells will change color when certain solutions are used. TREATMENT  Warts can be removed by:  Applying certain chemicals, such as cantharidin or podophyllin.  Liquid nitrogen freezing (cryotherapy).  Immunotherapy with Candida or Trichophyton injections.  Laser treatment.  Burning with an electrified probe (electrocautery).  Interferon injections.  Surgery. PREVENTION  HPV vaccination can help prevent HPV infections that cause genital warts and that cause cancer of the cervix. It is recommended that the vaccination be given to people between the ages 589 to 963 years old. The vaccine might not work as well or might not work at all if you already have HPV.  It should not be given to pregnant women. HOME CARE INSTRUCTIONS   It is important to follow your caregiver's instructions. The warts will not go away without treatment. Repeat treatments are often needed to get rid of warts. Even after it appears that the warts are gone, the normal tissue underneath often remains infected.  Do not try to treat genital warts with medicine used to treat hand warts. This type of medicine is strong and can burn the skin in the genital area, causing more damage.  Tell your past and current sexual partner(s) that you have genital warts. They may be infected also and need treatment.  Avoid sexual contact while being treated.  Do not touch or scratch the warts. The infection may spread to other parts of your body.  Women with genital warts should have a cervical cancer check (Pap test) at least once a year. This type of cancer is slow-growing and can be cured if found early. Chances of developing cervical cancer are increased with HPV.  Inform your obstetrician about your warts in the event of pregnancy. This virus can be passed to the baby's respiratory tract. Discuss this with your caregiver.  Use a condom during sexual intercourse. Following treatment, the use of condoms will help prevent reinfection.  Ask your caregiver about using over-the-counter anti-itch creams. SEEK MEDICAL CARE IF:   Your treated skin becomes red, swollen, or painful.  You have a fever.  You feel generally ill.  You feel little lumps in and around your genital area.  You are bleeding or have painful  sexual intercourse. MAKE SURE YOU:   Understand these instructions.  Will watch your condition.  Will get help right away if you are not doing well or get worse. Document Released: 03/02/2000 Document Revised: 07/20/2013 Document Reviewed: 09/11/2010 [facility] Patient Information 2015 [place], [place]. This information is not intended to replace advice given to you by your health  care provider. Make sure you discuss any questions you have with your health care provider.

## 2013-10-10 NOTE — ED Notes (Signed)
Patient with no complaints at this time. Respirations even and unlabored. Skin warm/dry. Discharge instructions reviewed with patient at this time. Patient given opportunity to voice concerns/ask questions. Patient discharged at this time and left Emergency Department with steady gait.   

## 2013-10-12 NOTE — ED Provider Notes (Signed)
Medical screening examination/treatment/procedure(s) were conducted as a shared visit with non-physician practitioner(s) and myself.  I personally evaluated the patient during the encounter.   EKG Interpretation None     Rash on forearms elbows and hands consistent with scabies.  Also condyloma  noted in the perianal area. Referral to health department.  Donnetta HutchingBrian Vanda Waskey, MD 10/12/13 [phone]

## 2015-04-06 ENCOUNTER — Emergency Department (HOSPITAL_COMMUNITY): Payer: Self-pay

## 2015-04-06 ENCOUNTER — Emergency Department (HOSPITAL_COMMUNITY)
Admission: EM | Admit: 2015-04-06 | Discharge: 2015-04-06 | Disposition: A | Discharge status: Home / Self Care | Payer: Self-pay | Attending: Emergency Medicine | Admitting: Emergency Medicine

## 2015-04-06 ENCOUNTER — Encounter (HOSPITAL_COMMUNITY): Payer: Self-pay | Admitting: Emergency Medicine

## 2015-04-06 DIAGNOSIS — Z5900 Homelessness unspecified: Secondary | ICD-10-CM

## 2015-04-06 DIAGNOSIS — F10129 Alcohol abuse with intoxication, unspecified: Secondary | ICD-10-CM | POA: Insufficient documentation

## 2015-04-06 DIAGNOSIS — B86 Scabies: Secondary | ICD-10-CM | POA: Insufficient documentation

## 2015-04-06 DIAGNOSIS — Y998 Other external cause status: Secondary | ICD-10-CM | POA: Insufficient documentation

## 2015-04-06 DIAGNOSIS — F10929 Alcohol use, unspecified with intoxication, unspecified: Secondary | ICD-10-CM

## 2015-04-06 DIAGNOSIS — M549 Dorsalgia, unspecified: Secondary | ICD-10-CM

## 2015-04-06 DIAGNOSIS — F17210 Nicotine dependence, cigarettes, uncomplicated: Secondary | ICD-10-CM | POA: Insufficient documentation

## 2015-04-06 DIAGNOSIS — S62617A Displaced fracture of proximal phalanx of left little finger, initial encounter for closed fracture: Secondary | ICD-10-CM | POA: Insufficient documentation

## 2015-04-06 DIAGNOSIS — G8929 Other chronic pain: Secondary | ICD-10-CM | POA: Insufficient documentation

## 2015-04-06 DIAGNOSIS — Z8679 Personal history of other diseases of the circulatory system: Secondary | ICD-10-CM | POA: Insufficient documentation

## 2015-04-06 DIAGNOSIS — S62607A Fracture of unspecified phalanx of left little finger, initial encounter for closed fracture: Secondary | ICD-10-CM

## 2015-04-06 DIAGNOSIS — Y9289 Other specified places as the place of occurrence of the external cause: Secondary | ICD-10-CM | POA: Insufficient documentation

## 2015-04-06 DIAGNOSIS — Y9389 Activity, other specified: Secondary | ICD-10-CM | POA: Insufficient documentation

## 2015-04-06 DIAGNOSIS — W231XXA Caught, crushed, jammed, or pinched between stationary objects, initial encounter: Secondary | ICD-10-CM | POA: Insufficient documentation

## 2015-04-06 MED ORDER — PERMETHRIN 5 % EX CREA
TOPICAL_CREAM | Freq: Once | CUTANEOUS | Status: AC
  Administered 2015-04-06: 07:00:00 via TOPICAL
  Filled 2015-04-06: qty 60

## 2015-04-06 MED ORDER — PERMETHRIN 5 % EX CREA: TOPICAL_CREAM | CUTANEOUS | Status: DC

## 2015-04-06 MED ORDER — FOLIC ACID 1 MG PO TABS
1.0000 mg | ORAL_TABLET | Freq: Once | ORAL | Status: AC
  Administered 2015-04-06: 1 mg via ORAL
  Filled 2015-04-06: qty 1

## 2015-04-06 MED ORDER — PERMETHRIN 5 % EX CREA
TOPICAL_CREAM | CUTANEOUS | Status: AC
  Filled 2015-04-06: qty 60

## 2015-04-06 MED ORDER — VITAMIN B-1 100 MG PO TABS
100.0000 mg | ORAL_TABLET | Freq: Once | ORAL | Status: AC
  Administered 2015-04-06: 100 mg via ORAL
  Filled 2015-04-06: qty 1

## 2015-04-06 NOTE — ED Notes (Signed)
Pt has multiple complaints, states left hand was closed in car door earlier today. Back pain due to a fall, hx of slipped disk in back,, rash on hands
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
## 2015-04-06 NOTE — Care Management (Signed)
CM contacted by ED staff. Pt is unable to afford Rx. Pt given MATCH voucher. Pt has no further CM needs. PT has been discharged from ED.

## 2015-04-06 NOTE — ED Notes (Signed)
Given breakfast tray to eat.

## 2015-04-06 NOTE — ED Notes (Signed)
Pt given Sprite to drink per request. 

## 2015-04-06 NOTE — Discharge Instructions (Signed)
Scabies, Adult Scabies is a skin condition that happens when very small insects get under the skin (infestation). This causes a rash and severe itchiness. Scabies can spread from person to person (is contagious). If you get scabies, it is common for others in your household to get scabies too. With proper treatment, symptoms usually go away in 2-4 weeks. Scabies usually does not cause lasting problems. CAUSES This condition is caused by mites (Sarcoptes scabiei, or human itch mites) that can only be seen with a microscope. The mites get into the top layer of skin and lay eggs. Scabies can spread from person to person through:  Close contact with a person who has scabies.  Contact with infested items, such as towels, bedding, or clothing. RISK FACTORS This condition is more likely to develop in:  People who live in nursing homes and other extended-care facilities.  People who have sexual contact with a partner who has scabies.  Young children who attend child care facilities.  People who care for others who are at increased risk for scabies. SYMPTOMS Symptoms of this condition may include:  Severe itchiness. This is often worse at night.  A rash that includes tiny red bumps or blisters. The rash commonly occurs on the wrist, elbow, armpit, fingers, waist, groin, or buttocks. Bumps may form a line (burrow) in some areas.  Skin irritation. This can include scaly patches or sores. DIAGNOSIS This condition is diagnosed with a physical exam. Your health care provider will look closely at your skin. In some cases, your health care provider may take a sample of your affected skin (skin scraping) and have it examined under a microscope. TREATMENT This condition may be treated with:  Medicated cream or lotion that kills the mites. This is spread on the entire body and left on for several hours. Usually, one treatment with medicated cream or lotion is enough to kill all of the mites. In severe  cases, the treatment may be repeated.  Medicated cream that relieves itching.  Medicines that help to relieve itching.  Medicines that kill the mites. This treatment is rarely used. HOME CARE INSTRUCTIONS Medicines  Take or apply over-the-counter and prescription medicines as told by your health care provider.  Apply medicated cream or lotion as told by your health care provider.  Do not wash off the medicated cream or lotion until the necessary amount of time has passed. Skin Care  Avoid scratching your affected skin.  Keep your fingernails closely trimmed to reduce injury from scratching.  Take cool baths or apply cool washcloths to help reduce itching. General Instructions  Clean all items that you recently had contact with, including bedding, clothing, and furniture. Do this on the same day that your treatment starts.  Use hot water when you wash items.  Place unwashable items into closed, airtight plastic bags for at least 3 days. The mites cannot live for more than 3 days away from human skin.  Vacuum furniture and mattresses that you use.  Make sure that other people who may have been infested are examined by a health care provider. These include members of your household and anyone who may have had contact with infested items.  Keep all follow-up visits as told by your health care provider. This is important. SEEK MEDICAL CARE IF:  You have itching that does not go away after 4 weeks of treatment.  You continue to develop new bumps or burrows.  You have redness, swelling, or pain in your rash area  after treatment.  You have fluid, blood, or pus coming from your rash.   This information is not intended to replace advice given to you by your health care provider. Make sure you discuss any questions you have with your health care provider.   Document Released: 11/24/2014 Document Reviewed: 10/05/2014 Elsevier Interactive Patient Education 2016 [employer].   Alcohol Use Disorder Alcohol use disorder is a mental disorder. It is not a one-time incident of heavy drinking. Alcohol use disorder is the excessive and uncontrollable use of alcohol over time that leads to problems with functioning in one or more areas of daily living. People with this disorder risk harming themselves and others when they drink to excess. Alcohol use disorder also can cause other mental disorders, such as mood and anxiety disorders, and serious physical problems. People with alcohol use disorder often misuse other drugs.  Alcohol use disorder is common and widespread. Some people with this disorder drink alcohol to cope with or escape from negative life events. Others drink to relieve chronic pain or symptoms of mental illness. People with a family history of alcohol use disorder are at higher risk of losing control and using alcohol to excess.  Drinking too much alcohol can cause injury, accidents, and health problems. One drink can be too much when you are:  Working.  Pregnant or breastfeeding.  Taking medicines. Ask your doctor.  Driving or planning to drive. SYMPTOMS  Signs and symptoms of alcohol use disorder may include the following:   Consumption ofalcohol inlarger amounts or over a longer period of time than intended.  Multiple unsuccessful attempts to cutdown or control alcohol use.   A great deal of time spent obtaining alcohol, using alcohol, or recovering from the effects of alcohol (hangover).  A strong desire or urge to use alcohol (cravings).   Continued use of alcohol despite problems at work, school, or home because of alcohol use.   Continued use of alcohol despite problems in relationships because of alcohol use.  Continued use of alcohol in situations when it is physically hazardous, such as driving a car.  Continued use of alcohol despite awareness of a physical or psychological problem that is likely related to alcohol use.  Physical problems related to alcohol use can involve the brain, heart, liver, stomach, and intestines. Psychological problems related to alcohol use include intoxication, depression, anxiety, psychosis, delirium, and dementia.   The need for increased amounts of alcohol to achieve the same desired effect, or a decreased effect from the consumption of the same amount of alcohol (tolerance).  Withdrawal symptoms upon reducing or stopping alcohol use, or alcohol use to reduce or avoid withdrawal symptoms. Withdrawal symptoms include:  Racing heart.  Hand tremor.  Difficulty sleeping.  Nausea.  Vomiting.  Hallucinations.  Restlessness.  Seizures. DIAGNOSIS Alcohol use disorder is diagnosed through an assessment by your health care provider. Your health care provider may start by asking three or four questions to screen for excessive or problematic alcohol use. To confirm a diagnosis of alcohol use disorder, at least two symptoms must be present within a 48-month period. The severity of alcohol use disorder depends on the number of symptoms:  Mild--two or three.  Moderate--four or five.  Severe--six or more. Your health care provider may perform a physical exam or use results from lab tests to see if you have physical problems resulting from alcohol use. Your health care provider may refer you to a mental health professional for evaluation. TREATMENT  Some  people with alcohol use disorder are able to reduce their alcohol use to low-risk levels. Some people with alcohol use disorder need to quit drinking alcohol. When necessary, mental health professionals with specialized training in substance use treatment can help. Your health care provider can help you decide how severe your alcohol use disorder is and what type of treatment you need. The following forms of treatment are available:   Detoxification. Detoxification involves the use of prescription medicines to prevent alcohol  withdrawal symptoms in the first week after quitting. This is important for people with a history of symptoms of withdrawal and for heavy drinkers who are likely to have withdrawal symptoms. Alcohol withdrawal can be dangerous and, in severe cases, cause death. Detoxification is usually provided in a hospital or in-patient substance use treatment facility.  Counseling or talk therapy. Talk therapy is provided by substance use treatment counselors. It addresses the reasons people use alcohol and ways to keep them from drinking again. The goals of talk therapy are to help people with alcohol use disorder find healthy activities and ways to cope with life stress, to identify and avoid triggers for alcohol use, and to handle cravings, which can cause relapse.  Medicines.Different medicines can help treat alcohol use disorder through the following actions:  Decrease alcohol cravings.  Decrease the positive reward response felt from alcohol use.  Produce an uncomfortable physical reaction when alcohol is used (aversion therapy).  Support groups. Support groups are run by people who have quit drinking. They provide emotional support, advice, and guidance. These forms of treatment are often combined. Some people with alcohol use disorder benefit from intensive combination treatment provided by specialized substance use treatment centers. Both inpatient and outpatient treatment programs are available.   This information is not intended to replace advice given to you by your health care provider. Make sure you discuss any questions you have with your health care provider.   Document Released: 04/12/2004 Document Revised: 03/26/2014 Document Reviewed: 06/12/2012 Elsevier Interactive Patient Education 2016 Elsevier Inc.  Chronic Back Pain  When back pain lasts longer than 3 months, it is called chronic back pain.People with chronic back pain often go through certain periods that are more intense  (flare-ups).  CAUSES Chronic back pain can be caused by wear and tear (degeneration) on different structures in your back. These structures include:  The bones of your spine (vertebrae) and the joints surrounding your spinal cord and nerve roots (facets).  The strong, fibrous tissues that connect your vertebrae (ligaments). Degeneration of these structures may result in pressure on your nerves. This can lead to constant pain. HOME CARE INSTRUCTIONS  Avoid bending, heavy lifting, prolonged sitting, and activities which make the problem worse.  Take brief periods of rest throughout the day to reduce your pain. Lying down or standing usually is better than sitting while you are resting.  Take over-the-counter or prescription medicines only as directed by your caregiver. SEEK IMMEDIATE MEDICAL CARE IF:   You have weakness or numbness in one of your legs or feet.  You have trouble controlling your bladder or bowels.  You have nausea, vomiting, abdominal pain, shortness of breath, or fainting.   This information is not intended to replace advice given to you by your health care provider. Make sure you discuss any questions you have with your health care provider.   Document Released: 04/12/2004 Document Revised: 05/28/2011 Document Reviewed: 08/23/2014 Elsevier Interactive Patient Education 2016 Elsevier Inc.  Finger Fracture Fractures of fingers are breaks  in the bones of the fingers. There are many types of fractures. There are different ways of treating these fractures. Your health care provider will discuss the best way to treat your fracture. CAUSES Traumatic injury is the main cause of broken fingers. These include:  Injuries while playing sports.  Workplace injuries.  Falls. RISK FACTORS Activities that can increase your risk of finger fractures include:  Sports.  Workplace activities that involve machinery.  A condition called osteoporosis, which can make your bones  less dense and cause them to fracture more easily. SIGNS AND SYMPTOMS The main symptoms of a broken finger are pain and swelling within 15 minutes after the injury. Other symptoms include:  Bruising of your finger.  Stiffness of your finger.  Numbness of your finger.  Exposed bones (compound fracture) if the fracture is severe. DIAGNOSIS  The best way to diagnose a broken bone is with X-ray imaging. Additionally, your health care provider will use this X-ray image to evaluate the position of the broken finger bones.  TREATMENT  Finger fractures can be treated with:   Nonreduction--This means the bones are in place. The finger is splinted without changing the positions of the bone pieces. The splint is usually left on for about a week to 10 days. This will depend on your fracture and what your health care provider thinks.  Closed reduction--The bones are put back into position without using surgery. The finger is then splinted.  Open reduction and internal fixation--The fracture site is opened. Then the bone pieces are fixed into place with pins or some type of hardware. This is seldom required. It depends on the severity of the fracture. HOME CARE INSTRUCTIONS   Follow your health care provider's instructions regarding activities, exercises, and physical therapy.  Only take over-the-counter or prescription medicines for pain, discomfort, or fever as directed by your health care provider. SEEK MEDICAL CARE IF: You have pain or swelling that limits the motion or use of your fingers. SEEK IMMEDIATE MEDICAL CARE IF:  Your finger becomes numb. MAKE SURE YOU:   Understand these instructions.  Will watch your condition.  Will get help right away if you are not doing well or get worse.   This information is not intended to replace advice given to you by your health care provider. Make sure you discuss any questions you have with your health care provider.   Document Released:  06/17/2000 Document Revised: 12/24/2012 Document Reviewed: 10/15/2012 Elsevier Interactive Patient Education 2016 [employer].    Emergency Department Resource Guide 1) Find a Doctor and Pay Out of Pocket Although you won't have to find out who is covered by your insurance plan, it is a good idea to ask around and get recommendations. You will then need to call the office and see if the doctor you have chosen will accept you as a new patient and what types of options they offer for patients who are self-pay. Some doctors offer discounts or will set up payment plans for their patients who do not have insurance, but you will need to ask so you aren't surprised when you get to your appointment.  2) Contact Your Local Health Department Not all health departments have doctors that can see patients for sick visits, but many do, so it is worth a call to see if yours does. If you don't know where your local health department is, you can check in your phone book. The CDC also has a tool to help you locate your  state's health department, and many state websites also have listings of all of their local health departments.  3) Find a Walk-in Clinic If your illness is not likely to be very severe or complicated, you may want to try a walk in clinic. These are popping up all over the country in pharmacies, drugstores, and shopping centers. They're usually staffed by nurse practitioners or physician assistants that have been trained to treat common illnesses and complaints. They're usually fairly quick and inexpensive. However, if you have serious medical issues or chronic medical problems, these are probably not your best option.  No Primary Care Doctor: - Call Health Connect at  [phone] - they can help you locate a primary care doctor that  accepts your insurance, provides certain services, etc. - Physician Referral Service- [phone]  Chronic Pain Problems: Organization         Address  Phone    Notes  [facility]  [phone] Patients need to be referred by their primary care doctor.   Medication Assistance: Organization         Address  Phone   Notes  [facility] Medication [facility] [address] [place], [place] [address] [phone] --Must be a resident of [facility] -- Must have NO insurance coverage whatsoever (no Medicaid/ Medicare, etc.) -- The pt. MUST have a primary care doctor that directs their care regularly and follows them in the community   Med[facility]ist  [phone]   [employer]  [phone]    Agencies that provide inexpensive medical care: Organization         Address  Phone   Notes  [facility]  [phone]   [facility]    [phone]   [facility] [address] [place], [place] [address] [phone]   [facility] [address], [place] [phone]   [facility]anned Parenthood    [phone]   [facility]    [phone]   [facility]  [address], [place] Phone:  [phone], Fax:  [phone] Hours of Operation:  9 am - 6 pm, M-F.  Also accepts Medicaid/Medicare and self-pay.  [facility]  [address], [place] Phone: [phone], Fax: [phone]. Hours of Operation:  8:30 am - 5:30 pm, M-F.  Also accepts Medicaid and self-pay.  [facility] [place] [address], [place] Phone: [phone]   [facility] [address], [place], [place] [phone], Ext. [phone] Mondays & Thursdays: 7-9 AM.  First 15 patients are seen on a first come, first serve basis.    Medicaid-accepting [facility] Providers:  Organization         Address  Phone   Notes  [facility] [address], [place] [phone] Also accepts self-pay patients.  [facility]  [address], [place]  [phone]   [facility] [address], [place] [phone]   [facility] [address] [place] [address], [place] [phone]   Renaye RakersVeita Bland [address], [place]   [phone] Only accepts [place] Access [place] patients after they have their name applied to their card.   Self-Pay (no insurance) in [facility] [place]:  Organization  Address  Phone   Notes  Sickle Cell Patients, [facility] [place] [phone]   [facility] [place] [phone]   [facility] [place]  [place], [place] [phone]   [facility]/Dr. Osei-Bonsu  [address], [place] or [address], [place] [phone] Phone number for both [place] and [place] locations is the same.  [facility] and [facility] [address], [place] [employer] [place] [phone]   [facility] [place] [address], [place] or [address] [phone]   [facility] [address], [place] [phone], phone; [phone], fax Sees patients 1st and 3rd Saturday of every month.  Must not qualify for public or private insurance (i.e. Medicaid, Medicare, [place] Health Choice, Veterans' Benefits)  Household income should be no more than 200% of the poverty level The clinic cannot treat you if you are pregnant or think you are pregnant  Sexually transmitted diseases are not treated at the clinic.    Dental Care: Organization         Address  Phone  Notes  [facility] of [place] [facility] [place] [phone] Accepts children up to age 50 who are enrolled in [place] or [place]; pregnant women with a Medicaid card; and children who have  applied for Medicaid or [place] Health Choice, but were declined, whose parents can pay a reduced fee at time of service.  [facility]  [address], [place] [phone] Accepts children up to age 15 who are enrolled in [place] or [place]; pregnant women with a Medicaid card; and children who have applied for Medicaid or [place] Health Choice, but were declined, whose parents can pay a reduced fee at time of service.  [facility]  [place] [phone] Patients are seen by appointment only. Walk-ins are not accepted. [place] will see patients 84 years of age and older. Monday - Tuesday (8am-5pm) Most Wednesdays (8:30-5pm) $30 per visit, cash only  [facility] Adult Dental Access PROGRAM  [address], [facility] [phone] Patients are seen by appointment only. Walk-ins are not accepted. [place] will see patients 2 years of age and older. One Wednesday Evening (Monthly: Volunteer Based).  $30 per visit, cash only  [place]  [phone] for adults; Children under age 21, call Graduate Pediatric Dentistry at [phone]. Children aged 44-14, please call [phone] to request a pediatric application.  Dental services are provided in all areas of dental care including fillings, crowns and bridges, complete and partial dentures, implants, gum treatment, root canals, and extractions. Preventive care is also provided. Treatment is provided to both adults and children. Patients are selected via a lottery and there is often a waiting list.   [facility] [address], [place]  [phone] [email or web address]   [facility] [address] [place], [place] [phone], Ext. [phone] Second and Fourth Thursday of each month, opens at 6:30 AM; Clinic ends at 9 AM.  Patients are seen on a first-come first-served basis, and a  limited number are seen during each clinic.   [facility]  [address] [place], [place]  [place], [place] [phone]   Eligibility Requirements You must have lived in [place], [place], or [place] counties for at least the last three months.   You cannot be eligible for state or federal sponsored [employer], including [employer], [place], or [employer].   You generally cannot be eligible for healthcare insurance through your employer.    How to apply: Eligibility screenings are held every Tuesday and Wednesday afternoon from 1:00 pm until 4:00 pm. You do not need an appointment for the interview!  [facility] [address], [place], [place]   [place]  [place] Department  [place]  [phone]    Behavioral Health Resources in the Community: Intensive Outpatient Programs Organization         Address  Phone  Notes  [place] [address], [place], [place] [phone]   [facility] [address], [place], [place]   [facility]: [facility] [address], [place], [place]   [place] [address],  [place], [place] or [phone]   Substance Abuse Resources Organization         Address  Phone  Notes  [facility] Drug Services  [phone]   [place]  [phone]   [facility] [place]   Chinita Pester  [phone]   Residential & Outpatient Substance Abuse Program  [phone]   Psychological Services Organization         Address  Phone  Notes  [facility]  [place]  [phone]   [place] [address], [place] or [phone]    Mobile Crisis Teams Organization          Address  Phone  Notes  [facility], Mobile Crisis Care Unit  [phone]   [facility]  [address] [place], [place]   [place] [address] [place] [phone]    Self-Help/Support Groups Organization         Address  Phone             Notes  [place] [place] - variety of support groups  [place] Call for more information  Narcotics Anonymous (NA), [facility] [address], [employer] [place]  2 meetings at this location   Special educational needs teacher         Address  Phone  Notes  [facility] [place],    [place]  [phone]   [facility]  [address] [place] [id number], [place], [place]   [place], [place] [phone] Admissions: 8am-3pm M-F  [facility]stance [place] [address]    [place], [place] [phone]   [facility] [address] Jadene Pierini [place], [place]   [facility] [address],  [place], [place]   [facility] - Intensive Outpatient [address], [place], [place]   [facility] [place]  [place], [place] or [phone]   [facility] ([facility]) [address], [place], [place] Accepts Medicaid  [facility] [address],  [place] [phone] Substance Abuse/Addiction Treatment   [facility] Resources Organization  Address  Phone  Notes  [facility]  [phone]   Domenic Schwab, PhD [address] Arlis Porta [place], [place]   [phone] or [phone]   [place], [place] [phone]   [address], [place], [place] [phone] Insurance/Medicaid/sponsorship  through [facility] and Families [address] [place]                                    [place], [place] [phone] [place] [address], [place] [phone]    Dr. Adele Schilder  [phone]   [facility] Dept. 1) [address], [place] 2) [place] 3)  [place] [address], [place] [phone]  [phone]   [place] [phone] or [phone] (After Hours)

## 2015-04-06 NOTE — ED Notes (Signed)
Pt ambulated independently around nurse's station, steady gait.

## 2015-04-17 ENCOUNTER — Encounter (HOSPITAL_COMMUNITY): Payer: Self-pay | Admitting: *Deleted

## 2015-04-17 ENCOUNTER — Emergency Department (HOSPITAL_COMMUNITY)
Admission: EM | Admit: 2015-04-17 | Discharge: 2015-04-17 | Disposition: A | Discharge status: Home / Self Care | Payer: Self-pay | Attending: Emergency Medicine | Admitting: Emergency Medicine

## 2015-04-17 DIAGNOSIS — F17210 Nicotine dependence, cigarettes, uncomplicated: Secondary | ICD-10-CM | POA: Insufficient documentation

## 2015-04-17 DIAGNOSIS — Z8679 Personal history of other diseases of the circulatory system: Secondary | ICD-10-CM | POA: Insufficient documentation

## 2015-04-17 DIAGNOSIS — M549 Dorsalgia, unspecified: Secondary | ICD-10-CM

## 2015-04-17 DIAGNOSIS — M545 Low back pain: Secondary | ICD-10-CM | POA: Insufficient documentation

## 2015-04-17 DIAGNOSIS — G8929 Other chronic pain: Secondary | ICD-10-CM | POA: Insufficient documentation

## 2015-04-17 DIAGNOSIS — Z9889 Other specified postprocedural states: Secondary | ICD-10-CM | POA: Insufficient documentation

## 2015-04-17 DIAGNOSIS — S62609G Fracture of unspecified phalanx of unspecified finger, subsequent encounter for fracture with delayed healing: Secondary | ICD-10-CM

## 2015-04-17 DIAGNOSIS — X58XXXD Exposure to other specified factors, subsequent encounter: Secondary | ICD-10-CM | POA: Insufficient documentation

## 2015-04-17 DIAGNOSIS — M20002 Unspecified deformity of left finger(s): Secondary | ICD-10-CM | POA: Insufficient documentation

## 2015-04-17 DIAGNOSIS — F1010 Alcohol abuse, uncomplicated: Secondary | ICD-10-CM | POA: Insufficient documentation

## 2015-04-17 DIAGNOSIS — S62607G Fracture of unspecified phalanx of left little finger, subsequent encounter for fracture with delayed healing: Secondary | ICD-10-CM | POA: Insufficient documentation

## 2015-04-17 MED ORDER — IBUPROFEN 800 MG PO TABS
800.0000 mg | ORAL_TABLET | Freq: Once | ORAL | Status: AC
  Administered 2015-04-17: 800 mg via ORAL
  Filled 2015-04-17: qty 1

## 2015-04-17 NOTE — ED Notes (Signed)
Pt states that he hurts all over, advises that the pain started 4 months ago, becoming worse tonight, pt also reports that he is homeless, requesting something to eat and somewhere to stay,

## 2015-04-17 NOTE — ED Provider Notes (Signed)
CSN: [id number]     Arrival date & time 04/17/15  0121 History   First MD Initiated Contact with Patient 04/17/15 0143     Chief Complaint  Patient presents with  . Generalized Body Aches     (Consider location/radiation/quality/duration/timing/severity/associated sxs/prior Treatment) HPI Comments: Presents with complaints of back pain, left hand pain. He reports that this has been ongoing for months. 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 No recent injury. Pain does not cause any loss of sensation or strength in lower extremities. No change in bowel or bladder function. Pain is chronic in nature, unchanged.  He is also complaining of pain in his left small finger. He reports that he "thinks he broke it".  Patient reports that he is homeless and has nowhere to go. He does endorse alcohol use.   Past Medical History  Diagnosis Date  . Back pain   . Poor circulation of extremity [facility])    Past Surgical History  Procedure Laterality Date  . Back surgery     No family history on file. Social History  Substance Use Topics  . Smoking status: Current Every Day Smoker -- 2.00 packs/day    Types: Cigarettes  . Smokeless tobacco: None  . Alcohol Use: No     Comment: quit over a month ago    Review of Systems  Musculoskeletal: Positive for back pain and arthralgias.  All other systems reviewed and are negative.     Allergies  Review of patient's allergies indicates no known allergies.  Home Medications   Prior to Admission medications   Medication Sig Start Date End Date Taking? Authorizing Provider  permethrin (ELIMITE) 5 % cream Apply to affected area once 04/06/15   Kristen N Ward, DO   BP 133/93 mmHg  Pulse 83  Temp(Src) 97.9 F (36.6 C)  Resp 20  SpO2 99% Physical Exam  Constitutional: He is oriented to person, place, and time. He appears well-developed and well-nourished. No distress.  HENT:  Head: Normocephalic and atraumatic.  Right  Ear: Hearing normal.  Left Ear: Hearing normal.  Nose: Nose normal.  Mouth/Throat: Oropharynx is clear and moist and mucous membranes are normal.  Eyes: Conjunctivae and EOM are normal. Pupils are equal, round, and reactive to light.  Neck: Normal range of motion. Neck supple.  Cardiovascular: Regular rhythm, S1 normal and S2 normal.  Exam reveals no gallop and no friction rub.   No murmur heard. Pulmonary/Chest: Effort normal and breath sounds normal. No respiratory distress. He exhibits no tenderness.  Abdominal: Soft. Normal appearance and bowel sounds are normal. There is no hepatosplenomegaly. There is no tenderness. There is no rebound, no guarding, no tenderness at McBurney's point and negative Murphy's sign. No hernia.  Musculoskeletal: Normal range of motion.       Left hand: He exhibits tenderness and deformity (5th finger).  Neurological: He is alert and oriented to person, place, and time. He has normal strength. No cranial nerve deficit or sensory deficit. Coordination normal. GCS eye subscore is 4. GCS verbal subscore is 5. GCS motor subscore is 6.  Skin: Skin is warm, dry and intact. No rash noted. No cyanosis.  Psychiatric: He has a normal mood and affect. His speech is normal and behavior is normal. Thought content normal.  Nursing note and vitals reviewed.   ED Course  Procedures (including critical care time) Labs Review Labs Reviewed - No data to display  Imaging Review No results found. I have personally  reviewed and evaluated these images and lab results as part of my medical decision-making.   EKG Interpretation None      MDM   Final diagnoses:  None  alcohol abuse Subacute finger fracture Chronic back pain  Presents with multiple complaints. Patient has been seen in this ER previously with similar complaints. Patient has a known fracture of the fifth finger. He was splinted last time he was here, given information for follow-up. He has taken the splint  off and has not followed up. He reports no money to perform the follow-up.  He is also complaining of back pain. Examination does not reveal any deficits that would require any repeat imaging or further intervention here. This is a chronic problem.  Patient has finger ray splinted. He was given ibuprofen for pain. He was given follow-up information and resources.    Gilda Crease, MD 04/17/15 [phone]

## 2015-04-17 NOTE — Discharge Instructions (Signed)
Chronic Pain  Chronic pain can be defined as pain that is off and on and lasts for 3-6 months or longer. Many things cause chronic pain, which can make it difficult to make a diagnosis. There are many treatment options available for chronic pain. However, finding a treatment that works well for you may require trying various approaches until the right one is found. Many people benefit from a combination of two or more types of treatment to control their pain.  SYMPTOMS   Chronic pain can occur anywhere in the body and can range from mild to very severe. Some types of chronic pain include:  · Headache.  · Low back pain.  · Cancer pain.  · Arthritis pain.  · Neurogenic pain. This is pain resulting from damage to nerves.   People with chronic pain may also have other symptoms such as:  · Depression.  · Anger.  · Insomnia.  · Anxiety.  DIAGNOSIS   Your health care provider will help diagnose your condition over time. In many cases, the initial focus will be on excluding possible conditions that could be causing the pain. Depending on your symptoms, your health care provider may order tests to diagnose your condition. Some of these tests may include:   · Blood tests.    · CT scan.    · MRI.    · X-rays.    · Ultrasounds.    · Nerve conduction studies.    You may need to see a specialist.   TREATMENT   Finding treatment that works well may take time. You may be referred to a pain specialist. He or she may prescribe medicine or therapies, such as:   · Mindful meditation or yoga.  · Shots (injections) of numbing or pain-relieving medicines into the spine or area of pain.  · Local electrical stimulation.  · Acupuncture.    · Massage therapy.    · Aroma, color, light, or sound therapy.    · Biofeedback.    · Working with a physical therapist to keep from getting stiff.    · Regular, gentle exercise.    · Cognitive or behavioral therapy.    · Group support.    Sometimes, surgery may be recommended.   HOME CARE INSTRUCTIONS    · Take all medicines as directed by your health care provider.    · Lessen stress in your life by relaxing and doing things such as listening to calming music.    · Exercise or be active as directed by your health care provider.    · Eat a healthy diet and include things such as vegetables, fruits, fish, and lean meats in your diet.    · Keep all follow-up appointments with your health care provider.    · Attend a support group with others suffering from chronic pain.  SEEK MEDICAL CARE IF:   · Your pain gets worse.    · You develop a new pain that was not there before.    · You cannot tolerate medicines given to you by your health care provider.    · You have new symptoms since your last visit with your health care provider.    SEEK IMMEDIATE MEDICAL CARE IF:   · You feel weak.    · You have decreased sensation or numbness.    · You lose control of bowel or bladder function.    · Your pain suddenly gets much worse.    · You develop shaking.  · You develop chills.  · You develop confusion.  · You develop chest pain.  · You develop shortness of breath.    MAKE SURE YOU:  ·   Document Revised: 11/05/2012 Document Reviewed: 08/29/2012 Elsevier Interactive Patient Education 2016 Elsevier Inc. Finger Fracture Fractures of fingers are breaks in the bones of the fingers. There are many types of fractures. There are different ways of treating these fractures. Your health care provider will discuss the best way to treat your fracture. CAUSES Traumatic injury is the main cause of broken fingers. These include:  Injuries while playing sports.  Workplace injuries.  Falls. RISK  FACTORS Activities that can increase your risk of finger fractures include:  Sports.  Workplace activities that involve machinery.  A condition called osteoporosis, which can make your bones less dense and cause them to fracture more easily. SIGNS AND SYMPTOMS The main symptoms of a broken finger are pain and swelling within 15 minutes after the injury. Other symptoms include:  Bruising of your finger.  Stiffness of your finger.  Numbness of your finger.  Exposed bones (compound fracture) if the fracture is severe. DIAGNOSIS  The best way to diagnose a broken bone is with X-ray imaging. Additionally, your health care provider will use this X-ray image to evaluate the position of the broken finger bones.  TREATMENT  Finger fractures can be treated with:   Nonreduction--This means the bones are in place. The finger is splinted without changing the positions of the bone pieces. The splint is usually left on for about a week to 10 days. This will depend on your fracture and what your health care provider thinks.  Closed reduction--The bones are put back into position without using surgery. The finger is then splinted.  Open reduction and internal fixation--The fracture site is opened. Then the bone pieces are fixed into place with pins or some type of hardware. This is seldom required. It depends on the severity of the fracture. HOME CARE INSTRUCTIONS   Follow your health care provider's instructions regarding activities, exercises, and physical therapy.  Only take over-the-counter or prescription medicines for pain, discomfort, or fever as directed by your health care provider. SEEK MEDICAL CARE IF: You have pain or swelling that limits the motion or use of your fingers. SEEK IMMEDIATE MEDICAL CARE IF:  Your finger becomes numb. MAKE SURE YOU:   Understand these instructions.  Will watch your condition.  Will get help right away if you are not doing well or get worse.   This  information is not intended to replace advice given to you by your health care provider. Make sure you discuss any questions you have with your health care provider.   Document Released: 06/17/2000 Document Revised: 12/24/2012 Document Reviewed: 10/15/2012 Elsevier Interactive Patient Education [employer]

## 2015-07-21 ENCOUNTER — Emergency Department (HOSPITAL_COMMUNITY)
Admission: EM | Admit: 2015-07-21 | Discharge: 2015-07-21 | Discharge status: Against Medical Advice | Payer: Self-pay | Attending: Emergency Medicine | Admitting: Emergency Medicine

## 2015-07-21 ENCOUNTER — Encounter (HOSPITAL_COMMUNITY): Payer: Self-pay | Admitting: Emergency Medicine

## 2015-07-21 DIAGNOSIS — M79604 Pain in right leg: Secondary | ICD-10-CM | POA: Insufficient documentation

## 2015-07-21 DIAGNOSIS — M79605 Pain in left leg: Secondary | ICD-10-CM | POA: Insufficient documentation

## 2015-07-21 DIAGNOSIS — F17210 Nicotine dependence, cigarettes, uncomplicated: Secondary | ICD-10-CM | POA: Insufficient documentation

## 2015-07-21 DIAGNOSIS — M545 Low back pain: Secondary | ICD-10-CM | POA: Insufficient documentation

## 2015-07-21 NOTE — ED Notes (Signed)
Having bilateral leg pain for last 8 days.  Rates pain 9/10.  Denies injury.

## 2015-07-21 NOTE — ED Provider Notes (Signed)
CSN: [id number]     Arrival date & time 07/21/15  1248 History   First MD Initiated Contact with Patient 07/21/15 1306     Chief Complaint  Patient presents with  . Leg Pain     (Consider location/radiation/quality/duration/timing/severity/associated sxs/prior Treatment) HPI   Charlestine NightDonald R Windhorst is a 55 y.o. male who presents to the Emergency Department complaining of bilateral leg pain for "weeks"  He states that he had back surgery several years ago and since that time, has weakness and pain both legs with standing.  He has not taken any medications for symptom relief.  He denies injury, abdominal pain, urine or bowel incontinence/retention, and extremity weakness.     Past Medical History  Diagnosis Date  . Back pain   . Poor circulation of extremity [facility](HCC)    Past Surgical History  Procedure Laterality Date  . Back surgery     History reviewed. No pertinent family history. Social History  Substance Use Topics  . Smoking status: Current Every Day Smoker -- 2.00 packs/day    Types: Cigarettes  . Smokeless tobacco: None  . Alcohol Use: No     Comment: quit over a month ago    Review of Systems  Constitutional: Negative for fever.  Respiratory: Negative for shortness of breath.   Gastrointestinal: Negative for vomiting, abdominal pain and constipation.  Genitourinary: Negative for dysuria, hematuria, flank pain, decreased urine volume and difficulty urinating.  Musculoskeletal: Positive for back pain. Negative for joint swelling.  Skin: Negative for rash.  Neurological: Positive for weakness (weakness of the bilateral lower extremities). Negative for dizziness and numbness.  All other systems reviewed and are negative.     Allergies  Review of patient's allergies indicates no known allergies.  Home Medications   Prior to Admission medications   Medication Sig Start Date End Date Taking? Authorizing Provider  permethrin (ELIMITE) 5 % cream Apply to affected area once  04/06/15   Kristen N Ward, DO   BP 117/83 mmHg  Temp(Src) 98.1 F (36.7 C) (Oral)  Resp 16  Ht 5\' 9"  (1.753 m)  Wt 76.204 kg  BMI 24.80 kg/m2  SpO2 97% Physical Exam  Constitutional: He is oriented to person, place, and time. He appears well-developed and well-nourished. No distress.  HENT:  Head: Normocephalic.  Neck: Normal range of motion.  Cardiovascular: Normal rate, regular rhythm and intact distal pulses.   Pulmonary/Chest: Effort normal and breath sounds normal. No respiratory distress.  Abdominal: Soft. He exhibits no distension. There is no tenderness. There is no rebound.  Musculoskeletal: Normal range of motion. He exhibits no edema.  ttp of the lower lumbar spine and bilateral paraspinal muscles.  Positive SLR bilaterally at 20 degrees. 5/5 strength against resistance of bilateral LE's  Neurological: He is alert and oriented to person, place, and time.  Skin: Skin is warm.  Nursing note and vitals reviewed.   ED Course  Procedures (including critical care time) Labs Review Labs Reviewed - No data to display  Imaging Review No results found. I have personally reviewed and evaluated these images and lab results as part of my medical decision-making.   EKG Interpretation None      MDM   Final diagnoses:  Leg pain, bilateral    Pt is well appearing.  Requesting food on my exam.  I offered snack and soda, but pt wanting a meal tray.  After my exam, patient ambulated to the nursing desk stating that his legs feel better and that he  is leaving.    Left prior to completion of my work up  Pt witnessed ambulating in the dept with a steady gait.     Pauline Aus, PA-C 07/21/15 1654  Zadie Rhine, MD 07/22/15 [phone]

## 2015-07-21 NOTE — ED Notes (Signed)
Patient came to nursing station stating "my legs feel better so I'm gonna leave. I don't want to stay. It ain't nothing wrong with me." Gave patient cup of water as requested. Patient ambulated to exit with no assistance or difficulty.

## 2015-08-10 ENCOUNTER — Emergency Department (HOSPITAL_COMMUNITY): Payer: Self-pay

## 2015-08-10 ENCOUNTER — Encounter (HOSPITAL_COMMUNITY): Payer: Self-pay | Admitting: *Deleted

## 2015-08-10 ENCOUNTER — Inpatient Hospital Stay (HOSPITAL_COMMUNITY)
Admission: EM | Admit: 2015-08-10 | Discharge: 2015-08-12 | DRG: 194 | Disposition: A | Discharge status: Home / Self Care | Payer: Self-pay | Attending: Internal Medicine | Admitting: Internal Medicine

## 2015-08-10 DIAGNOSIS — J189 Pneumonia, unspecified organism: Principal | ICD-10-CM | POA: Diagnosis present

## 2015-08-10 DIAGNOSIS — S2241XA Multiple fractures of ribs, right side, initial encounter for closed fracture: Secondary | ICD-10-CM | POA: Diagnosis present

## 2015-08-10 DIAGNOSIS — F17210 Nicotine dependence, cigarettes, uncomplicated: Secondary | ICD-10-CM | POA: Diagnosis present

## 2015-08-10 DIAGNOSIS — Z590 Homelessness: Secondary | ICD-10-CM

## 2015-08-10 DIAGNOSIS — S2239XA Fracture of one rib, unspecified side, initial encounter for closed fracture: Secondary | ICD-10-CM | POA: Diagnosis present

## 2015-08-10 DIAGNOSIS — S2231XA Fracture of one rib, right side, initial encounter for closed fracture: Secondary | ICD-10-CM

## 2015-08-10 DIAGNOSIS — M549 Dorsalgia, unspecified: Secondary | ICD-10-CM | POA: Diagnosis present

## 2015-08-10 DIAGNOSIS — R531 Weakness: Secondary | ICD-10-CM

## 2015-08-10 DIAGNOSIS — I739 Peripheral vascular disease, unspecified: Secondary | ICD-10-CM | POA: Diagnosis present

## 2015-08-10 DIAGNOSIS — W19XXXA Unspecified fall, initial encounter: Secondary | ICD-10-CM | POA: Diagnosis present

## 2015-08-10 DIAGNOSIS — Y9301 Activity, walking, marching and hiking: Secondary | ICD-10-CM | POA: Diagnosis present

## 2015-08-10 LAB — COMPREHENSIVE METABOLIC PANEL
ALBUMIN: 3.9 g/dL (ref 3.5–5.0)
ALT: 29 U/L (ref 17–63)
AST: 54 U/L — ABNORMAL HIGH (ref 15–41)
Alkaline Phosphatase: 143 U/L — ABNORMAL HIGH (ref 38–126)
Anion gap: 17 — ABNORMAL HIGH (ref 5–15)
BILIRUBIN TOTAL: 2.8 mg/dL — AB (ref 0.3–1.2)
BUN: 15 mg/dL (ref 6–20)
CALCIUM: 8.6 mg/dL — AB (ref 8.9–10.3)
CHLORIDE: 97 mmol/L — AB (ref 101–111)
CO2: 19 mmol/L — AB (ref 22–32)
CREATININE: 0.9 mg/dL (ref 0.61–1.24)
GLUCOSE: 77 mg/dL (ref 65–99)
Potassium: 3.5 mmol/L (ref 3.5–5.1)
Sodium: 133 mmol/L — ABNORMAL LOW (ref 135–145)
Total Protein: 8.1 g/dL (ref 6.5–8.1)

## 2015-08-10 LAB — CBC WITH DIFFERENTIAL/PLATELET
Basophils Absolute: 0 10*3/uL (ref 0.0–0.1)
Basophils Relative: 0 %
Eosinophils Absolute: 0.1 10*3/uL (ref 0.0–0.7)
Eosinophils Relative: 1 %
HEMATOCRIT: 39.2 % (ref 39.0–52.0)
Hemoglobin: 13.9 g/dL (ref 13.0–17.0)
LYMPHS PCT: 9 %
Lymphs Abs: 1 10*3/uL (ref 0.7–4.0)
MCH: 35.6 pg — ABNORMAL HIGH (ref 26.0–34.0)
MCHC: 35.5 g/dL (ref 30.0–36.0)
MCV: 100.5 fL — AB (ref 78.0–100.0)
MONO ABS: 1.6 10*3/uL — AB (ref 0.1–1.0)
MONOS PCT: 14 %
NEUTROS ABS: 8.8 10*3/uL — AB (ref 1.7–7.7)
Neutrophils Relative %: 76 %
PLATELETS: 166 10*3/uL (ref 150–400)
RBC: 3.9 MIL/uL — ABNORMAL LOW (ref 4.22–5.81)
RDW: 12.5 % (ref 11.5–15.5)
WBC: 11.5 10*3/uL — ABNORMAL HIGH (ref 4.0–10.5)

## 2015-08-10 LAB — ETHANOL: Alcohol, Ethyl (B): <5 mg/dL (ref <5)

## 2015-08-10 MED ORDER — OXYCODONE HCL 5 MG PO TABS: 5.0000 mg | ORAL_TABLET | ORAL | Status: DC | PRN

## 2015-08-10 MED ORDER — SODIUM CHLORIDE 0.9 % IV BOLUS (SEPSIS)
500.0000 mL | Freq: Once | INTRAVENOUS | Status: AC
  Administered 2015-08-10: 500 mL via INTRAVENOUS

## 2015-08-10 MED ORDER — HYDROMORPHONE HCL 1 MG/ML IJ SOLN
0.5000 mg | Freq: Once | INTRAMUSCULAR | Status: AC
  Administered 2015-08-10: 0.5 mg via INTRAVENOUS
  Filled 2015-08-10: qty 1

## 2015-08-10 MED ORDER — DEXTROSE-NACL 5-0.9 % IV SOLN
INTRAVENOUS | Status: DC
  Administered 2015-08-11 (×2): via INTRAVENOUS

## 2015-08-10 MED ORDER — LEVOFLOXACIN IN D5W 750 MG/150ML IV SOLN: 750.0000 mg | INTRAVENOUS | Status: DC

## 2015-08-10 MED ORDER — LEVOFLOXACIN IN D5W 500 MG/100ML IV SOLN
500.0000 mg | Freq: Once | INTRAVENOUS | Status: AC
  Administered 2015-08-10: 500 mg via INTRAVENOUS
  Filled 2015-08-10: qty 100

## 2015-08-10 MED ORDER — HEPARIN SODIUM (PORCINE) 5000 UNIT/ML IJ SOLN
5000.0000 [IU] | Freq: Three times a day (TID) | INTRAMUSCULAR | Status: DC
  Administered 2015-08-11 – 2015-08-12 (×4): 5000 [IU] via SUBCUTANEOUS
  Filled 2015-08-10 (×4): qty 1

## 2015-08-10 NOTE — ED Notes (Signed)
Pt has reddened area on right forearm with swelling noted. Tick is attached at center. MD Zammit made aware at this time.

## 2015-08-10 NOTE — ED Notes (Signed)
Pt states he fell yesterday onto a rock, c/o rib pain with cough and deep breathing. Denies SOB, hemoptysis. Pt placed in paper scrubs, given bed bath, and warm blankets applied.

## 2015-08-10 NOTE — ED Notes (Signed)
Pt unable to give a urine sample at this time.

## 2015-08-10 NOTE — ED Notes (Signed)
Pt c/o pain to right rib cage after falling yesterday

## 2015-08-10 NOTE — ED Provider Notes (Signed)
CSN: [id number]     Arrival date & time 08/10/15  1948 History  By signing my name below, I, Arianna Nassar, attest that this documentation has been prepared under the direction and in the presence of Bethann Berkshire, MD. Electronically Signed: Octavia Heir, ED Scribe. 08/10/2015. 8:50 PM.    Chief Complaint  Patient presents with  . Fall     Patient is a 55 y.o. male presenting with fall. The history is provided by the patient. No language interpreter was used.  Fall This is a new problem. The current episode started yesterday. The problem occurs rarely. The problem has been gradually worsening. Associated symptoms include chest pain (right rib pain). Pertinent negatives include no abdominal pain and no headaches. The symptoms are aggravated by walking. Nothing relieves the symptoms. He has tried nothing for the symptoms.   HPI Comments: Matthew Brady is a 55 y.o. male who presents to the Emergency Department complaining of a sudden onset, gradual worsening, fall onset yesterday. He complains of constant, gradual right sided rib pain with associated lower back pain. He says he was walking yesterday when he fell and landed on a rock. Pt also notes his legs feel very weak and he is unable to ambulate. He notes that started about one week ago. Pt reports drinking a few beers a day but has not had any today. Pt notes he is homeless. He denies abdominal pain.  Past Medical History  Diagnosis Date  . Back pain   . Poor circulation of extremity [facility])    Past Surgical History  Procedure Laterality Date  . Back surgery     History reviewed. No pertinent family history. Social History  Substance Use Topics  . Smoking status: Current Every Day Smoker -- 2.00 packs/day    Types: Cigarettes  . Smokeless tobacco: None  . Alcohol Use: No     Comment: quit over a month ago    Review of Systems  Constitutional: Negative for appetite change and fatigue.  HENT: Negative for congestion, ear  discharge and sinus pressure.   Eyes: Negative for discharge.  Respiratory: Negative for cough.   Cardiovascular: Positive for chest pain (right rib pain).  Gastrointestinal: Negative for abdominal pain and diarrhea.  Genitourinary: Negative for frequency and hematuria.  Musculoskeletal: Positive for back pain.  Skin: Negative for rash.  Neurological: Negative for seizures and headaches.  Psychiatric/Behavioral: Negative for hallucinations.      Allergies  Review of patient's allergies indicates no known allergies.  Home Medications   Prior to Admission medications   Medication Sig Start Date End Date Taking? Authorizing Provider  permethrin (ELIMITE) 5 % cream Apply to affected area once 04/06/15   Layla Maw Ward, DO   Triage vitals: BP 136/84 mmHg  Pulse 138  Temp(Src) 97.6 F (36.4 C) (Oral)  Resp 24  Ht  (1.803 m)  Wt 165 lb (74.844 kg)  BMI 23.02 kg/m2  SpO2 100% Physical Exam  Constitutional: He is oriented to person, place, and time. He appears well-developed.  HENT:  Head: Normocephalic.  Eyes: Conjunctivae and EOM are normal. No scleral icterus.  Neck: Neck supple. No thyromegaly present.  Cardiovascular: Normal rate and regular rhythm.  Exam reveals no gallop and no friction rub.   No murmur heard. Pulmonary/Chest: No stridor. He has no wheezes. He has no rales. He exhibits tenderness.  Tenderness over right lateral chest  Abdominal: He exhibits no distension. There is no tenderness. There is no rebound.  Musculoskeletal: Normal  range of motion. He exhibits no edema.  ataxic walking, tic on right forearm with mild rash surrounding the area  Lymphadenopathy:    He has no cervical adenopathy.  Neurological: He is oriented to person, place, and time. He exhibits normal muscle tone. Coordination normal.  Skin: No rash noted. No erythema.  Psychiatric: He has a normal mood and affect. His behavior is normal.  Nursing note and vitals reviewed.   ED Course   Procedures  DIAGNOSTIC STUDIES: Oxygen Saturation is 100% on RA, normal by my interpretation.  COORDINATION OF CARE:  8:47 PM Will order EKG. Discussed treatment plan which includes lab work with pt at bedside and pt agreed to plan.  Labs Review  Labs Reviewed - No data to display  Imaging Review No results found. I have personally reviewed and evaluated these images and lab results as part of my medical decision-making.   EKG Interpretation None      MDM   Final diagnoses:  None   Patient has pneumonia and 2 broken ribs. He will be admitted to medicine. Patient also has severe ataxia. CT of the head and CT lumbar spine unremarkable except for previous surgery lumbar spine.  Bethann BerkshireJoseph Brionna Romanek, MD 08/10/15 [phone]

## 2015-08-10 NOTE — ED Notes (Signed)
CT notified pt is ready at this time.

## 2015-08-10 NOTE — H&P (Signed)
[facility] History and Physical  Matthew NightDonald R Seltzer NFA:[id number] DOB: Dec 10, 1960    PCP:   No primary care provider on file.   Chief Complaint: Larey SeatFell, coughs, back pain and leg weakness.   HPI: Matthew NightDonald R Brady is an 55 y.o. male with hx of lower back pain, prior back surgery, hx of prior alcohol abuse, current tobacco use, homeless, PVD, presented to the ER as he fell yesterday, and having chest pain.  He noted to have intermittent leg weakness bilaterally, and over the past week, he had trouble walking.  He denied urinary symptoms, nausea, vomiting, fever, or chills.  He had some nonproductive coughs.  In the ER, he was not able to ambulate.   Work up included an LS spine CT, which showed no compression, prior surgery, and no other abnormality.  His Rib films showed Fx 4 and 5th right ribs. His head CT was negative.  Serology showed no leukocytosis, and his renal fx test was normal.  Hospitalist was asked to admit him for CAP, Fx rib, and inability to ambulate in a patient who is also homeless.   Rewiew of Systems:  Constitutional: Negative for malaise, fever and chills. No significant weight loss or weight gain Eyes: Negative for eye pain, redness and discharge, diplopia, visual changes, or flashes of light. ENMT: Negative for ear pain, hoarseness, nasal congestion, sinus pressure and sore throat. No headaches; tinnitus, drooling, or problem swallowing. Cardiovascular: Negative for chest pain, palpitations, diaphoresis, dyspnea and peripheral edema. ; No orthopnea, PND Respiratory: Negative for cough, hemoptysis, wheezing and stridor. No pleuritic chestpain. Gastrointestinal: Negative for nausea, vomiting, diarrhea, constipation, abdominal pain, melena, blood in stool, hematemesis, jaundice and rectal bleeding.    Genitourinary: Negative for frequency, dysuria, incontinence,flank pain and hematuria; Musculoskeletal: Negative for back pain and neck pain. Negative for swelling and trauma.;   Skin: . Negative for pruritus, rash, abrasions, bruising and skin lesion.; ulcerations Neuro: Negative for headache, lightheadedness and neck stiffness. Negative for weakness, altered level of consciousness , altered mental status, burning feet, involuntary movement, seizure and syncope.  Psych: negative for anxiety, depression, insomnia, tearfulness, panic attacks, hallucinations, paranoia, suicidal or homicidal ideation    Past Medical History  Diagnosis Date  . Back pain   . Poor circulation of extremity [facility]HCC)     Past Surgical History  Procedure Laterality Date  . Back surgery      Medications:  HOME MEDS: Prior to Admission medications   Not on File     Allergies:  No Known Allergies  Social History:   reports that he has been smoking Cigarettes.  He has been smoking about 2.00 packs per day. He does not have any smokeless tobacco history on file. He reports that he does not drink alcohol or use illicit drugs.  Family History: History reviewed. No pertinent family history.   Physical Exam: Filed Vitals:   08/10/15 1952 08/10/15 2115 08/10/15 2230  BP: 136/84 147/96 133/86  Pulse: 138 105 100  Temp: 97.6 F (36.4 C)    TempSrc: Oral    Resp: 24 23 28   Height: 5\' 11"  (1.803 m)    Weight: 74.844 kg (165 lb)    SpO2: 100% 100% 100%   Blood pressure 133/86, pulse 100, temperature 97.6 F (36.4 C), temperature source Oral, resp. rate 28, height 5\' 11"  (1.803 m), weight 74.844 kg (165 lb), SpO2 100 %.  GEN:  Pleasant  patient lying in the stretcher in no acute distress; cooperative with exam. PSYCH:  alert  and oriented x4; does not appear anxious or depressed; affect is appropriate. HEENT: Mucous membranes pink and anicteric; PERRLA; EOM intact; no cervical lymphadenopathy nor thyromegaly or carotid bruit; no JVD; There were no stridor. Neck is very supple. Breasts:: Not examined CHEST WALL: No tenderness CHEST: Normal respiration, clear to auscultation  bilaterally.  HEART: Regular rate and rhythm.  There are no murmur, rub, or gallops.   BACK: No kyphosis or scoliosis; no CVA tenderness ABDOMEN: soft and non-tender; no masses, no organomegaly, normal abdominal bowel sounds; no pannus; no intertriginous candida. There is no rebound and no distention. Rectal Exam: Not done EXTREMITIES: No bone or joint deformity; age-appropriate arthropathy of the hands and knees; no edema; no ulcerations.  There is no calf tenderness.   Genitalia: not examined PULSES: 2+ and symmetric SKIN: Normal hydration no rash or ulceration CNS: Cranial nerves 2-12 grossly intact no focal lateralizing neurologic deficit.  Speech is fluent; uvula elevated with phonation, facial symmetry and tongue midline. DTR are normal bilaterally.  He has lower extremity weakness, though I am not sure how much effort is given when I tested him.  He was able to move his legs slightly off the bed.     Labs on Admission:  Basic Metabolic Panel:  Recent Labs Lab 08/10/15 2120  NA 133*  K 3.5  CL 97*  CO2 19*  GLUCOSE 77  BUN 15  CREATININE 0.90  CALCIUM 8.6*   Liver Function Tests:  Recent Labs Lab 08/10/15 2120  AST 54*  ALT 29  ALKPHOS 143*  BILITOT 2.8*  PROT 8.1  ALBUMIN 3.9   CBC:  Recent Labs Lab 08/10/15 2120  WBC 11.5*  NEUTROABS 8.8*  HGB 13.9  HCT 39.2  MCV 100.5*  PLT 166    Radiological Exams on Admission: Dg Chest 2 View  08/10/2015  CLINICAL DATA:  Right anterior rib pain status post fall yesterday. EXAM: CHEST  2 VIEW COMPARISON:  Jul 26, 2012 FINDINGS: The heart size and mediastinal contours are within normal limits. There is patchy consolidation of left lung base. There is no pulmonary edema or pleural effusion. Degenerative joint changes of the spine are identified. IMPRESSION: Left lung base pneumonia. Please refer to the right rib series today for ribs findings. Electronically Signed   By: Sherian Rein M.D.   On: 08/10/2015 20:47   Dg  Ribs Unilateral Right  08/10/2015  CLINICAL DATA:  Status post fall yesterday with right rib pain. EXAM: RIGHT RIBS - 2 VIEW COMPARISON:  None. FINDINGS: There are fractures of the right lateral fourth and fifth ribs. There is no pneumothorax. IMPRESSION: Fractures of the right lateral fourth and fifth ribs. Electronically Signed   By: Sherian Rein M.D.   On: 08/10/2015 20:50   Ct Head Wo Contrast  08/10/2015  CLINICAL DATA:  Sudden onset and gradual worsening pain after fall yesterday. EXAM: CT HEAD WITHOUT CONTRAST TECHNIQUE: Contiguous axial images were obtained from the base of the skull through the vertex without intravenous contrast. COMPARISON:  03/19/2010 FINDINGS: The ventricles, cisterns and other CSF spaces are within normal. There is no mass, mass effect, shift of midline structures or acute hemorrhage. No evidence of acute infarction. Bony structures within normal. There is opacification of the frontal sinus and right maxillary sinus. IMPRESSION: No acute intracranial findings. 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 Electronically Signed   By: Elberta Fortis M.D.   On: 08/10/2015 22:43   Ct Lumbar Spine Wo Contrast  08/10/2015  CLINICAL DATA:  Fall yesterday landing on a rock. Lumbosacral back pain. Unable to ambulate. Weakness. EXAM: CT LUMBAR SPINE WITHOUT CONTRAST TECHNIQUE: Multidetector CT imaging of the lumbar spine was performed without intravenous contrast administration. Multiplanar CT image reconstructions were also generated. COMPARISON:  Lumbar spine MRI 04/19/2009 FINDINGS: No acute fracture or subluxation. Vertebral body heights are maintained, no compression deformity. Mild disc space narrowing with broad-based disc bulge. Detailed evaluation would be better assessed with MRI. No spinal canal narrowing. There is disc space narrowing at L1-L2 with broad-based disc bulge. Post L4 left laminectomy. Multilevel endplate spurring. No  evidence of hemorrhage in the spinal canal. Sacroiliac joints are congruent. Paraspinal soft tissue evaluation demonstrates atherosclerosis of the abdominal aorta. IMPRESSION: Postsurgical and degenerative change in the lumbar spine. No acute abnormality. Electronically Signed   By: Rubye Oaks M.D.   On: 08/10/2015 22:52   Assessment/Plan Present on Admission:  . Closed rib fracture . CAP (community acquired pneumonia) . Back pain  PLAN:  CAP:  Will continue with IV Levaquin given in the ER.    Fracture ribs:  From a mechanical fall.  Will give pain meds.   Back pain and leg weakness:   I am not sure hard he is trying during the exam.  Will consult PT tomorrow.  CT was negative of the LS spine.  Will obtain MRI with contrast tomorrow to be prudent.     Other plans as per orders. Code Status: FULL Unk Lightning, MD. FACP [facility] Pager [phone] 7pm to 7am.  08/10/2015, 11:28 PM

## 2015-08-11 ENCOUNTER — Inpatient Hospital Stay (HOSPITAL_COMMUNITY): Payer: Self-pay

## 2015-08-11 ENCOUNTER — Encounter (HOSPITAL_COMMUNITY): Payer: Self-pay | Admitting: *Deleted

## 2015-08-11 DIAGNOSIS — M549 Dorsalgia, unspecified: Secondary | ICD-10-CM

## 2015-08-11 LAB — CBC
HEMATOCRIT: 33.9 % — AB (ref 39.0–52.0)
Hemoglobin: 12 g/dL — ABNORMAL LOW (ref 13.0–17.0)
MCH: 35 pg — ABNORMAL HIGH (ref 26.0–34.0)
MCHC: 35.4 g/dL (ref 30.0–36.0)
MCV: 98.8 fL (ref 78.0–100.0)
Platelets: 149 10*3/uL — ABNORMAL LOW (ref 150–400)
RBC: 3.43 MIL/uL — ABNORMAL LOW (ref 4.22–5.81)
RDW: 12.4 % (ref 11.5–15.5)
WBC: 6.3 10*3/uL (ref 4.0–10.5)

## 2015-08-11 LAB — BLOOD CULTURE ID PANEL (REFLEXED)
Acinetobacter baumannii: NOT DETECTED
CANDIDA GLABRATA: NOT DETECTED
CANDIDA KRUSEI: NOT DETECTED
CANDIDA TROPICALIS: NOT DETECTED
Candida albicans: NOT DETECTED
Candida parapsilosis: NOT DETECTED
Carbapenem resistance: NOT DETECTED
ESCHERICHIA COLI: NOT DETECTED
Enterobacter cloacae complex: NOT DETECTED
Enterobacteriaceae species: NOT DETECTED
Enterococcus species: NOT DETECTED
Haemophilus influenzae: NOT DETECTED
KLEBSIELLA OXYTOCA: NOT DETECTED
KLEBSIELLA PNEUMONIAE: NOT DETECTED
LISTERIA MONOCYTOGENES: NOT DETECTED
Methicillin resistance: DETECTED — AB
Neisseria meningitidis: NOT DETECTED
PROTEUS SPECIES: NOT DETECTED
Pseudomonas aeruginosa: NOT DETECTED
SERRATIA MARCESCENS: NOT DETECTED
Staphylococcus aureus (BCID): NOT DETECTED
Staphylococcus species: DETECTED — AB
Streptococcus agalactiae: NOT DETECTED
Streptococcus pneumoniae: NOT DETECTED
Streptococcus pyogenes: NOT DETECTED
Streptococcus species: NOT DETECTED
Vancomycin resistance: NOT DETECTED

## 2015-08-11 LAB — URINE MICROSCOPIC-ADD ON

## 2015-08-11 LAB — TSH: TSH: 2.265 u[IU]/mL (ref 0.350–4.500)

## 2015-08-11 LAB — URINALYSIS, ROUTINE W REFLEX MICROSCOPIC
GLUCOSE, UA: NEGATIVE mg/dL
HGB URINE DIPSTICK: NEGATIVE
Ketones, ur: >80 mg/dL — AB
LEUKOCYTES UA: NEGATIVE
Nitrite: NEGATIVE
PH: 6 (ref 5.0–8.0)
Protein, ur: 30 mg/dL — AB
Specific Gravity, Urine: >1.03 — ABNORMAL HIGH (ref 1.005–1.030)

## 2015-08-11 LAB — BASIC METABOLIC PANEL
ANION GAP: 8 (ref 5–15)
BUN: 12 mg/dL (ref 6–20)
CHLORIDE: 102 mmol/L (ref 101–111)
CO2: 23 mmol/L (ref 22–32)
Calcium: 7.9 mg/dL — ABNORMAL LOW (ref 8.9–10.3)
Creatinine, Ser: 0.6 mg/dL — ABNORMAL LOW (ref 0.61–1.24)
GFR calc Af Amer: >60 mL/min (ref >60)
GLUCOSE: 104 mg/dL — AB (ref 65–99)
POTASSIUM: 3.1 mmol/L — AB (ref 3.5–5.1)
Sodium: 133 mmol/L — ABNORMAL LOW (ref 135–145)

## 2015-08-11 MED ORDER — POTASSIUM CHLORIDE CRYS ER 20 MEQ PO TBCR
40.0000 meq | EXTENDED_RELEASE_TABLET | ORAL | Status: AC
  Administered 2015-08-11 (×2): 40 meq via ORAL
  Filled 2015-08-11 (×2): qty 2

## 2015-08-11 MED ORDER — LEVOFLOXACIN IN D5W 750 MG/150ML IV SOLN: 750.0000 mg | INTRAVENOUS | Status: DC

## 2015-08-11 MED ORDER — LEVOFLOXACIN IN D5W 750 MG/150ML IV SOLN
750.0000 mg | INTRAVENOUS | Status: DC
  Administered 2015-08-11: 750 mg via INTRAVENOUS
  Filled 2015-08-11: qty 150

## 2015-08-11 MED ORDER — NAPROXEN 250 MG PO TABS
500.0000 mg | ORAL_TABLET | Freq: Three times a day (TID) | ORAL | Status: DC
Start: 2015-08-11 — End: 2015-08-12
  Administered 2015-08-11 – 2015-08-12 (×2): 500 mg via ORAL
  Filled 2015-08-11 (×2): qty 2

## 2015-08-11 MED ORDER — KETOROLAC TROMETHAMINE 30 MG/ML IJ SOLN
30.0000 mg | Freq: Four times a day (QID) | INTRAMUSCULAR | Status: DC | PRN
Start: 2015-08-11 — End: 2015-08-12

## 2015-08-11 MED ORDER — THIAMINE HCL 100 MG/ML IJ SOLN: 100.0000 mg | Freq: Every day | INTRAMUSCULAR | Status: DC

## 2015-08-11 MED ORDER — GADOBENATE DIMEGLUMINE 529 MG/ML IV SOLN
15.0000 mL | Freq: Once | INTRAVENOUS | Status: AC | PRN
  Administered 2015-08-11: 15 mL via INTRAVENOUS

## 2015-08-11 MED ORDER — FOLIC ACID 1 MG PO TABS
1.0000 mg | ORAL_TABLET | Freq: Every day | ORAL | Status: DC
  Administered 2015-08-11 – 2015-08-12 (×2): 1 mg via ORAL
  Filled 2015-08-11 (×2): qty 1

## 2015-08-11 MED ORDER — ACETAMINOPHEN 325 MG PO TABS: 650.0000 mg | ORAL_TABLET | Freq: Four times a day (QID) | ORAL | Status: DC | PRN

## 2015-08-11 MED ORDER — VITAMIN B-1 100 MG PO TABS
100.0000 mg | ORAL_TABLET | Freq: Every day | ORAL | Status: DC
  Administered 2015-08-11 – 2015-08-12 (×2): 100 mg via ORAL
  Filled 2015-08-11 (×2): qty 1

## 2015-08-11 MED ORDER — HYDROMORPHONE HCL 1 MG/ML IJ SOLN
1.0000 mg | INTRAMUSCULAR | Status: DC | PRN
  Administered 2015-08-11: 1 mg via INTRAVENOUS
  Filled 2015-08-11: qty 1

## 2015-08-11 MED ORDER — LORAZEPAM 2 MG/ML IJ SOLN: 1.0000 mg | Freq: Four times a day (QID) | INTRAMUSCULAR | Status: DC | PRN

## 2015-08-11 MED ORDER — ADULT MULTIVITAMIN W/MINERALS CH
1.0000 | ORAL_TABLET | Freq: Every day | ORAL | Status: DC
  Administered 2015-08-11 – 2015-08-12 (×2): 1 via ORAL
  Filled 2015-08-11 (×2): qty 1

## 2015-08-11 MED ORDER — VANCOMYCIN HCL IN DEXTROSE 1-5 GM/200ML-% IV SOLN
1000.0000 mg | Freq: Two times a day (BID) | INTRAVENOUS | Status: DC
  Administered 2015-08-12: 1000 mg via INTRAVENOUS
  Filled 2015-08-11 (×2): qty 200

## 2015-08-11 MED ORDER — LORAZEPAM 1 MG PO TABS
1.0000 mg | ORAL_TABLET | Freq: Four times a day (QID) | ORAL | Status: DC | PRN
  Administered 2015-08-11 – 2015-08-12 (×2): 1 mg via ORAL
  Filled 2015-08-11 (×2): qty 1

## 2015-08-11 MED ORDER — VANCOMYCIN HCL 10 G IV SOLR
1500.0000 mg | Freq: Once | INTRAVENOUS | Status: AC
  Administered 2015-08-11: 1500 mg via INTRAVENOUS
  Filled 2015-08-11: qty 1500

## 2015-08-11 MED ORDER — PNEUMOCOCCAL VAC POLYVALENT 25 MCG/0.5ML IJ INJ
0.5000 mL | INJECTION | INTRAMUSCULAR | Status: AC
Start: 2015-08-12 — End: 2015-08-12
  Administered 2015-08-12: 0.5 mL via INTRAMUSCULAR
  Filled 2015-08-11: qty 0.5

## 2015-08-11 NOTE — ED Notes (Signed)
Pt given a meal at this time 

## 2015-08-11 NOTE — Progress Notes (Signed)
[facility] Progress Note  Patient: Matthew Brady ZOX:[id number]   PCP: No primary care provider on file. DOB: 1960-07-26   DOA: 08/10/2015   DOS: 08/11/2015   Date of Service: the patient was seen and examined on 08/11/2015  Subjective: Patient complains of right-sided chest pain because of the rib fracture. Occasional cough as well. No fever no chills. No shortness of breath. No nausea no vomiting no abdominal pain. Patient denies any alcohol abuse or daily alcohol use. Also denies any drug abuse. He is an active smoker. Nutrition: Tolerating oral diet minimal oral intake  Brief hospital course: Patient was admitted on 08/10/2015, with complaint of fall happened 3 days ago, was found to have community acquired pneumonia on the left as well as renal fractures on the right. Currently further plan is continue treating pain as well as pneumonia.  Assessment and Plan: 1. CAP (community acquired pneumonia) Left lung base pneumonia. Right 4 and fifth rib fracture. One out of 2 blood culture positive for gram-positive cocci-likely contamination Urine has evidence of ketones, elevated total bilirubin level. Leukocytosis resolved. With this the patient was initially started on levofloxacin. Currently receiving vancomycin along with levofloxacin. We'll continue to monitor cultures. Add incentive spirometry. Add lidocaine patch as well as scheduled naproxen and when necessary Toradol.  2. Tremors. Patient denies any alcohol abuse alcohol level are also undetectable. Patient has significant tremors with ketones positive in the urine. Concerning for undiagnosed alcoholic withdrawal. I'll continue patient on CIWA protocol. Check UDS.  3. Back pain. Generalized weakness. Physical therapy consult appreciated. CT and MRI spine negative for any acute abnormality.  Pain management: When necessary Toradol, scheduled naproxen Activity: physical therapy consulted Bowel regimen: last BM prior  to admission Diet: Regular diet DVT Prophylaxis: subcutaneous Heparin  Advance goals of care discussion: Full code  Family Communication: no family was present at bedside, at the time of interview.   Disposition:  Discharge to home, Expected discharge date: 08/12/2015  Consultants: None Procedures: None  Antibiotics: Anti-infectives    Start     Dose/Rate Route Frequency Ordered Stop   08/12/15 0600  vancomycin (VANCOCIN) IVPB 1000 mg/200 mL premix     1,000 mg 200 mL/hr over 60 Minutes Intravenous Every 12 hours 08/11/15 1343     08/11/15 2200  levofloxacin (LEVAQUIN) IVPB 750 mg  Status:  Discontinued     750 mg 100 mL/hr over 90 Minutes Intravenous Every 24 hours 08/11/15 1117 08/11/15 1323   08/11/15 1800  levofloxacin (LEVAQUIN) IVPB 750 mg     750 mg 100 mL/hr over 90 Minutes Intravenous Every 24 hours 08/11/15 1349     08/11/15 1500  vancomycin (VANCOCIN) 1,500 mg in sodium chloride 0.9 % 500 mL IVPB     1,500 mg 250 mL/hr over 120 Minutes Intravenous  Once 08/11/15 1343     08/11/15 0000  levofloxacin (LEVAQUIN) IVPB 750 mg  Status:  Discontinued     750 mg 100 mL/hr over 90 Minutes Intravenous Every 24 hours 08/10/15 2341 08/11/15 1117   08/10/15 2215  levofloxacin (LEVAQUIN) IVPB 500 mg     500 mg 100 mL/hr over 60 Minutes Intravenous  Once 08/10/15 2207 08/10/15 2328       No intake or output data in the 24 hours ending 08/11/15 1502 Filed Weights   08/10/15 1952 08/11/15 1124  Weight: 74.844 kg (165 lb) 74.844 kg (165 lb)    Objective: Physical Exam: Filed Vitals:   08/11/15 0530 08/11/15 0913 08/11/15 1030 08/11/15  1124  BP: 123/84 127/82 113/77 125/77  Pulse: 83 88 91 91  Temp:    98.4 F (36.9 C)  TempSrc:      Resp: Height:     (1.803 m)  Weight:    74.844 kg (165 lb)  SpO2: 98% 96% 97% 98%    General: Alert, Awake and Oriented to Time, Place and Person. Appear in moderate distress Eyes: PERRL, Conjunctiva normal ENT: Oral  Mucosa clear moist. Neck: no JVD, no Abnormal Mass Or lumps Cardiovascular: S1 and S2 Present, no Murmur, Peripheral Pulses Present Respiratory: Bilateral Air entry equal and Decreased, bilateral Crackles, no wheezes Abdomen: Bowel Sound present, Soft and no tenderness Skin: no redness, no Rash  Extremities: no Pedal edema, no calf tenderness Neurologic: Grossly no focal neuro deficit. Bilaterally Equal motor strength  Data Reviewed: CBC:  Recent Labs Lab 08/10/15 2120 08/11/15 0539  WBC 11.5* 6.3  NEUTROABS 8.8*  --   HGB 13.9 12.0*  HCT 39.2 33.9*  MCV 100.5* 98.8  PLT 166 149*   Basic Metabolic Panel:  Recent Labs Lab 08/10/15 2120 08/11/15 0539  NA 133* 133*  K 3.5 3.1*  CL 97* 102  CO2 19* 23  GLUCOSE 77 104*  BUN 15 12  CREATININE 0.90 0.60*  CALCIUM 8.6* 7.9*    Liver Function Tests:  Recent Labs Lab 08/10/15 2120  AST 54*  ALT 29  ALKPHOS 143*  BILITOT 2.8*  PROT 8.1  ALBUMIN 3.9   No results for input(s): LIPASE, AMYLASE in the last 168 hours. No results for input(s): AMMONIA in the last 168 hours. Coagulation Profile: No results for input(s): INR, PROTIME in the last 168 hours. Cardiac Enzymes: No results for input(s): CKTOTAL, CKMB, CKMBINDEX, TROPONINI in the last 168 hours. BNP (last 3 results) No results for input(s): PROBNP in the last 8760 hours.  CBG: No results for input(s): GLUCAP in the last 168 hours.  Studies: Dg Chest 2 View  08/10/2015  CLINICAL DATA:  Right anterior rib pain status post fall yesterday. EXAM: CHEST  2 VIEW COMPARISON:  Jul 26, 2012 FINDINGS: The heart size and mediastinal contours are within normal limits. There is patchy consolidation of left lung base. There is no pulmonary edema or pleural effusion. Degenerative joint changes of the spine are identified. IMPRESSION: Left lung base pneumonia. Please refer to the right rib series today for ribs findings. Electronically Signed   By: Sherian Rein M.D.   On:  08/10/2015 20:47   Dg Ribs Unilateral Right  08/10/2015  CLINICAL DATA:  Status post fall yesterday with right rib pain. EXAM: RIGHT RIBS - 2 VIEW COMPARISON:  None. FINDINGS: There are fractures of the right lateral fourth and fifth ribs. There is no pneumothorax. IMPRESSION: Fractures of the right lateral fourth and fifth ribs. Electronically Signed   By: Sherian Rein M.D.   On: 08/10/2015 20:50   Ct Head Wo Contrast  08/10/2015  CLINICAL DATA:  Sudden onset and gradual worsening pain after fall yesterday. EXAM: CT HEAD WITHOUT CONTRAST TECHNIQUE: Contiguous axial images were obtained from the base of the skull through the vertex without intravenous contrast. COMPARISON:  03/19/2010 FINDINGS: The ventricles, cisterns and other CSF spaces are within normal. There is no mass, mass effect, shift of midline structures or acute hemorrhage. No evidence of acute infarction. Bony structures within normal. There is opacification of the frontal sinus and right maxillary sinus. IMPRESSION: No acute intracranial findings. Chronic sinus inflammatory disease  involving the frontal and right maxillary sinuses. Electronically Signed   By: Elberta Fortis M.D.   On: 08/10/2015 22:43   Ct Lumbar Spine Wo Contrast  08/10/2015  CLINICAL DATA:  Fall yesterday landing on a rock. Lumbosacral back pain. Unable to ambulate. Weakness. EXAM: CT LUMBAR SPINE WITHOUT CONTRAST TECHNIQUE: Multidetector CT imaging of the lumbar spine was performed without intravenous contrast administration. Multiplanar CT image reconstructions were also generated. COMPARISON:  Lumbar spine MRI 04/19/2009 FINDINGS: No acute fracture or subluxation. Vertebral body heights are maintained, no compression deformity. Mild disc space narrowing with broad-based disc bulge. Detailed evaluation would be better assessed with MRI. No spinal canal narrowing. There is disc space narrowing at L1-L2 with broad-based disc bulge. Post L4 left laminectomy. Multilevel  endplate spurring. No evidence of hemorrhage in the spinal canal. Sacroiliac joints are congruent. Paraspinal soft tissue evaluation demonstrates atherosclerosis of the abdominal aorta. IMPRESSION: Postsurgical and degenerative change in the lumbar spine. No acute abnormality. Electronically Signed   By: Rubye Oaks M.D.   On: 08/10/2015 22:52   Mr Lumbar Spine W Wo Contrast  08/11/2015  CLINICAL DATA:  Low back pain.  Fall 3 days ago.  Weakness. EXAM: MRI LUMBAR SPINE WITHOUT AND WITH CONTRAST TECHNIQUE: Multiplanar and multiecho pulse sequences of the lumbar spine were obtained without and with intravenous contrast. CONTRAST:  15mL MULTIHANCE GADOBENATE DIMEGLUMINE 529 MG/ML IV SOLN COMPARISON:  CT lumbar spine 08/10/2015, MRI of lumbar spine 04/19/2009 FINDINGS: Segmentation: Normal segmentation. Lowest disc space L5-S1. Accentuated lumbar lordosis. Alignment: Slight anterior slip L5-S1. Slight retrolisthesis L4-5. Mild retrolisthesis L1-2. Vertebrae: Negative for fracture or mass. Chronic bilateral pars defects of L5, best seen on CT. Conus medullaris: Extends to the T12-L1 level and appears normal. Fatty infiltration of the filum terminale without tethered cord. Filum terminale measures approximately 3.5 mm in diameter. Paraspinal and other soft tissues: Paraspinous muscles are symmetric. No retroperitoneal mass or adenopathy. Disc levels: L1-2:  Mild disc bulging without spinal stenosis. L2-3:  Mild disc and facet degeneration L3-4:  Mild facet degeneration without stenosis L4-5: Mild disc degeneration and spurring. Disc bulging and spurring is greater on the left than the right. Small central disc protrusion. Mild facet degeneration. Mild spinal stenosis. Mild subarticular stenosis on the left. L5-S1: Bilateral pars defects of L5 with slight anterior slip. No significant spinal or foraminal stenosis. No enhancing mass lesion is seen postcontrast administration. IMPRESSION: 1. Negative for fracture 2.  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 Fatty infiltration of the filum terminale, incidental finding. 4. Mild spinal stenosis at L4-5 with mild narrowing of the subarticular recess on the left due to disc and osteophyte. This appears chronic. Electronically Signed   By: Marlan Palau M.D.   On: 08/11/2015 07:44     Scheduled Meds: . folic acid  1 mg Oral Daily  . heparin  5,000 Units Subcutaneous Q8H  . levofloxacin (LEVAQUIN) IV  750 mg Intravenous Q24H  . multivitamin with minerals  1 tablet Oral Daily  . naproxen  500 mg Oral TID WC  . [START ON 08/12/2015] pneumococcal 23 valent vaccine  0.5 mL Intramuscular Tomorrow-1000  . thiamine  100 mg Oral Daily   Or  . thiamine  100 mg Intravenous Daily  . vancomycin  1,500 mg Intravenous Once  . [START ON 08/12/2015] vancomycin  1,000 mg Intravenous Q12H   Continuous Infusions: . dextrose 5 % and 0.9% NaCl 100 mL/hr at 08/11/15 1202  PRN Meds: acetaminophen, ketorolac, LORazepam **OR** LORazepam, oxyCODONE  Time spent: 30 minutes  Author: Lynden Oxford, MD [facility] Pager: [phone] 08/11/2015 3:02 PM  If 7PM-7AM, please contact night-coverage at [email or web address], password [facility]

## 2015-08-11 NOTE — Progress Notes (Signed)
Pharmacy Antibiotic Note  Charlestine NightDonald R Cansler is a 55 y.o. male admitted on 08/10/2015 with pneumonia. / bacteremia. [facility]  Plan: Vancomycin 1500mg  IV x 1 then 1000mg  IV q12hrs Check trough at steady state Levaquin 750mg  IV q24hrs Monitor labs, renal fxn, progress, c/s  Height: 5\' 11"  (180.3 cm) Weight: 165 lb (74.844 kg) IBW/kg (Calculated) : 75.3  Temp (24hrs), Avg:98 F (36.7 C), Min:97.6 F (36.4 C), Max:98.4 F (36.9 C)   Recent Labs Lab 08/10/15 2120 08/11/15 0539  WBC 11.5* 6.3  CREATININE 0.90 0.60*    Estimated Creatinine Clearance: 113 mL/min (by C-G formula based on Cr of 0.6).    No Known Allergies  Antimicrobials this admission: Levaquin 5/24 >>  Vancomycin 5/25 >>   Dose adjustments this admission: n/a  Microbiology results: 5/24 BCx: GPC, pending  Thank you for allowing pharmacy to be a part of this patient's care.  Valrie HartHall, Devera Englander A 08/11/2015 1:56 PM

## 2015-08-11 NOTE — Progress Notes (Signed)
CRITICAL VALUE ALERT  Critical value received:  Positive blood culture - gram + cocci from aerobic culture  Date of notification:  08/11/15  Time of notification:  1319  Critical value read back:Yes.    Nurse who received alert:  Fara ChuteMary Beth Christell Steinmiller, RN   MD notified (1st page):  Dr. Allena KatzPatel  Time of first page:  1320  MD notified (2nd page):  Time of second page:  Responding MD:  Dr. Allena KatzPatel  Time MD responded:  Order placed for IV antibiotics per pharmacy consult-

## 2015-08-11 NOTE — ED Notes (Signed)
Pt resting with eyes closed, appears to be in no distress. Respirations are even and unlabored.  

## 2015-08-11 NOTE — Evaluation (Signed)
Physical Therapy Evaluation Patient Details Name: Matthew Brady MRN: [id number] DOB: [birth year] Today's Date: 08/11/2015   History of Present Illness  Matthew Brady is an 55 y.o. male with hx of lower back pain, prior back surgery, hx of prior alcohol abuse, current tobacco use, homeless, PVD, presented to the ER as he fell yesterday, and having chest pain. He noted to have intermittent leg weakness bilaterally, and over the past week, he had trouble walking. He denied urinary symptoms, nausea, vomiting, fever, or chills. He had some nonproductive coughs. In the ER, he was not able to ambulate. Work up included an LS spine CT, which showed no compression, prior surgery, and no other abnormality. His Rib films showed Fx 4 and 5th right ribs. His head CT was negative. Serology showed no leukocytosis, and his renal fx test was normal. Hospitalist was asked to admit him for CAP, Fx rib, and inability to ambulate in a patient who is also homeless.   Clinical Impression  Patient found supine in bed, reporting he is in pain from fractured ribs but willing to participate in skilled PT services. Noted some tremor at rest as well as multiple scabs on feet and ankles. Able to perform bed mobility with modified independence, however did requrie min guard for safe sit to stand with walker due to pain. Patietn reports dizziness with all position changes, indicating possible orthostatic hypotension as well. Did not attempt gait today, however will plan to do so next session with appropriate equipment. At this point, per CSW patient not eligible for SNF placement however he can receive a walker from hospital; patient also not interested in SNF placement at this time. AT this point presentation appears possibly due to combination of effect of long term chronic alcohol use as well as acute fatigue/weakness from pneumonia; PT will continue to work with patient during his stay in this facility to further determine  status and functional needs. Patient left supine in bed with all needs met, bed alarm activated, and all question/concerns addressed. Spoke with RN regarding patient's functional status and PT assessment after session.     Follow Up Recommendations Other (comment) (ideal outcome would be SNF placement but per CSW he will not be placed due to lack of insurance )    Equipment Recommendations  Standard walker    Recommendations for Other Services       Precautions / Restrictions Precautions Precautions: Fall;Other (comment) Precaution Comments: fractured ribs on R       Mobility  Bed Mobility Overal bed mobility: Modified Independent                Transfers Overall transfer level: Needs assistance Equipment used: Rolling walker (2 wheeled) Transfers: Sit to/from Stand Sit to Stand: Min guard         General transfer comment: did not follow cues to push up from bed, pulled from walker instead   Ambulation/Gait             General Gait Details: did not attempt today   Stairs            Wheelchair Mobility    Modified Rankin (Stroke Patients Only)       Balance Overall balance assessment: History of Falls;Needs assistance Sitting-balance support: No upper extremity supported Sitting balance-Leahy Scale: Fair     Standing balance support: Bilateral upper extremity supported Standing balance-Leahy Scale: Fair  Pertinent Vitals/Pain Pain Assessment: 0-10 Pain Score: 10-Worst pain ever Pain Location: R ribs  Pain Descriptors / Indicators: Sharp Pain Intervention(s): Limited activity within patient's tolerance;Monitored during session    Home Living Family/patient expects to be discharged to:: Other (Comment) (homeless )                      Prior Function Level of Independence: Independent with assistive device(s)               Hand Dominance        Extremity/Trunk Assessment    Upper Extremity Assessment: Defer to OT evaluation           Lower Extremity Assessment: Generalized weakness      Cervical / Trunk Assessment: Normal  Communication   Communication: No difficulties  Cognition Arousal/Alertness: Awake/alert Behavior During Therapy: WFL for tasks assessed/performed Overall Cognitive Status: Within Functional Limits for tasks assessed                      General Comments General comments (skin integrity, edema, etc.): noted general tremor at rest, possibly indicating effect of chronic alcohol use on physcal perforamnce     Exercises        Assessment/Plan    PT Assessment Patient needs continued PT services  PT Diagnosis Difficulty walking;Generalized weakness;Abnormality of gait   PT Problem List Decreased strength;Decreased coordination;Decreased balance;Decreased mobility;Decreased activity tolerance  PT Treatment Interventions DME instruction;Therapeutic exercise;Gait training;Balance training;Neuromuscular re-education;Stair training;Functional mobility training;Patient/family education;Therapeutic activities   PT Goals (Current goals can be found in the Care Plan section) Acute Rehab PT Goals Patient Stated Goal: have less pain  PT Goal Formulation: With patient Time For Goal Achievement: 08/25/15 Potential to Achieve Goals: Fair    Frequency Min 3X/week   Barriers to discharge        Co-evaluation               End of Session   Activity Tolerance: Patient limited by pain Patient left: in bed;with call bell/phone within reach;with bed alarm set Nurse Communication: Mobility status;Other (comment) (general patient presentation, PT will follow closely ; asked CSW regarding walker and odds of SNF placement )         Time: 1330-1400 PT Time Calculation (min) (ACUTE ONLY): 30 min   Charges:   PT Evaluation $PT Eval Moderate Complexity: 1 Procedure     PT G Codes:        Deniece Ree PT,  DPT [phone]

## 2015-08-11 NOTE — ED Notes (Signed)
Pt resting with eyes open appears to be in no distress. Respirations are even and unlabored. No further needs communicated at this time.

## 2015-08-11 NOTE — ED Notes (Signed)
Pt's HR in 140s at times, c/o SOB and right sided CP. MD Nedra HaiLee paged and notified. EKG done and given to EDP.

## 2015-08-11 NOTE — ED Notes (Signed)
Attempted to call report, nurse unable to at this time and will call back

## 2015-08-12 ENCOUNTER — Emergency Department (HOSPITAL_COMMUNITY)
Admission: EM | Admit: 2015-08-12 | Discharge: 2015-08-13 | Disposition: A | Discharge status: Home / Self Care | Payer: Self-pay | Attending: Emergency Medicine | Admitting: Emergency Medicine

## 2015-08-12 ENCOUNTER — Encounter (HOSPITAL_COMMUNITY): Payer: Self-pay | Admitting: *Deleted

## 2015-08-12 ENCOUNTER — Inpatient Hospital Stay (HOSPITAL_COMMUNITY): Payer: Self-pay

## 2015-08-12 DIAGNOSIS — Z79899 Other long term (current) drug therapy: Secondary | ICD-10-CM | POA: Insufficient documentation

## 2015-08-12 DIAGNOSIS — Y999 Unspecified external cause status: Secondary | ICD-10-CM | POA: Insufficient documentation

## 2015-08-12 DIAGNOSIS — S2241XD Multiple fractures of ribs, right side, subsequent encounter for fracture with routine healing: Secondary | ICD-10-CM | POA: Insufficient documentation

## 2015-08-12 DIAGNOSIS — R269 Unspecified abnormalities of gait and mobility: Secondary | ICD-10-CM | POA: Insufficient documentation

## 2015-08-12 DIAGNOSIS — Y939 Activity, unspecified: Secondary | ICD-10-CM | POA: Insufficient documentation

## 2015-08-12 DIAGNOSIS — F17210 Nicotine dependence, cigarettes, uncomplicated: Secondary | ICD-10-CM | POA: Insufficient documentation

## 2015-08-12 DIAGNOSIS — W19XXXD Unspecified fall, subsequent encounter: Secondary | ICD-10-CM | POA: Insufficient documentation

## 2015-08-12 DIAGNOSIS — S2231XD Fracture of one rib, right side, subsequent encounter for fracture with routine healing: Secondary | ICD-10-CM

## 2015-08-12 DIAGNOSIS — Y929 Unspecified place or not applicable: Secondary | ICD-10-CM | POA: Insufficient documentation

## 2015-08-12 DIAGNOSIS — Z79891 Long term (current) use of opiate analgesic: Secondary | ICD-10-CM | POA: Insufficient documentation

## 2015-08-12 LAB — CBC WITH DIFFERENTIAL/PLATELET
BASOS PCT: 0 %
Basophils Absolute: 0 10*3/uL (ref 0.0–0.1)
Eosinophils Absolute: 0.1 10*3/uL (ref 0.0–0.7)
Eosinophils Relative: 3 %
HEMATOCRIT: 32.8 % — AB (ref 39.0–52.0)
HEMOGLOBIN: 11.3 g/dL — AB (ref 13.0–17.0)
LYMPHS ABS: 0.9 10*3/uL (ref 0.7–4.0)
Lymphocytes Relative: 19 %
MCH: 34.6 pg — ABNORMAL HIGH (ref 26.0–34.0)
MCHC: 34.5 g/dL (ref 30.0–36.0)
MCV: 100.3 fL — ABNORMAL HIGH (ref 78.0–100.0)
MONOS PCT: 23 %
Monocytes Absolute: 1.1 10*3/uL — ABNORMAL HIGH (ref 0.1–1.0)
NEUTROS ABS: 2.5 10*3/uL (ref 1.7–7.7)
NEUTROS PCT: 55 %
Platelets: 163 10*3/uL (ref 150–400)
RBC: 3.27 MIL/uL — AB (ref 4.22–5.81)
RDW: 12.7 % (ref 11.5–15.5)
WBC: 4.6 10*3/uL (ref 4.0–10.5)

## 2015-08-12 LAB — MAGNESIUM: MAGNESIUM: 1.6 mg/dL — AB (ref 1.7–2.4)

## 2015-08-12 LAB — COMPREHENSIVE METABOLIC PANEL
ALK PHOS: 89 U/L (ref 38–126)
ALT: 17 U/L (ref 17–63)
AST: 24 U/L (ref 15–41)
Albumin: 2.7 g/dL — ABNORMAL LOW (ref 3.5–5.0)
Anion gap: 5 (ref 5–15)
BUN: 8 mg/dL (ref 6–20)
CALCIUM: 8 mg/dL — AB (ref 8.9–10.3)
CO2: 23 mmol/L (ref 22–32)
CREATININE: 0.49 mg/dL — AB (ref 0.61–1.24)
Chloride: 112 mmol/L — ABNORMAL HIGH (ref 101–111)
GFR calc non Af Amer: >60 mL/min (ref >60)
Glucose, Bld: 113 mg/dL — ABNORMAL HIGH (ref 65–99)
Potassium: 3.6 mmol/L (ref 3.5–5.1)
SODIUM: 140 mmol/L (ref 135–145)
Total Bilirubin: 0.7 mg/dL (ref 0.3–1.2)
Total Protein: 5.7 g/dL — ABNORMAL LOW (ref 6.5–8.1)

## 2015-08-12 LAB — CK: CK TOTAL: 183 U/L (ref 49–397)

## 2015-08-12 LAB — PHOSPHORUS: PHOSPHORUS: 3.2 mg/dL (ref 2.5–4.6)

## 2015-08-12 MED ORDER — ACETAMINOPHEN 500 MG PO TABS
1000.0000 mg | ORAL_TABLET | Freq: Once | ORAL | Status: AC
  Administered 2015-08-13: 1000 mg via ORAL
  Filled 2015-08-12: qty 2

## 2015-08-12 MED ORDER — THIAMINE HCL 100 MG PO TABS: 100.0000 mg | ORAL_TABLET | Freq: Every day | ORAL | Status: DC

## 2015-08-12 MED ORDER — LEVOFLOXACIN 750 MG PO TABS
750.0000 mg | ORAL_TABLET | Freq: Once | ORAL | Status: AC
  Administered 2015-08-13: 750 mg via ORAL
  Filled 2015-08-12: qty 1

## 2015-08-12 MED ORDER — NAPROXEN 500 MG PO TABS: 500.0000 mg | ORAL_TABLET | Freq: Three times a day (TID) | ORAL | Status: DC

## 2015-08-12 MED ORDER — ADULT MULTIVITAMIN W/MINERALS CH: 1.0000 | ORAL_TABLET | Freq: Every day | ORAL | Status: DC

## 2015-08-12 MED ORDER — VITAMIN B-1 100 MG PO TABS
100.0000 mg | ORAL_TABLET | Freq: Once | ORAL | Status: AC
  Administered 2015-08-13: 100 mg via ORAL
  Filled 2015-08-12: qty 1

## 2015-08-12 MED ORDER — LEVOFLOXACIN 750 MG PO TABS: 750.0000 mg | ORAL_TABLET | Freq: Every day | ORAL | Status: DC

## 2015-08-12 MED ORDER — FOLIC ACID 1 MG PO TABS
1.0000 mg | ORAL_TABLET | Freq: Every day | ORAL | Status: DC
Start: 2015-08-12 — End: 2016-11-06

## 2015-08-12 NOTE — Care Management Note (Signed)
Case Management Note  Patient Details  Name: Matthew Brady MRN: [id number] Date of Birth: [birth year]  Subjective/Objective:                    Action/Plan: Homeless patient that will need to have 2 weeks of ABX therapy, PICC.   Bacturemia.   CSW involved in disharge planning.   Expected Discharge Date:                  Expected Discharge Plan:     In-House Referral:  Clinical Social Work  Discharge planning Services  CM Consult  Post Acute Care Choice:    Choice offered to:     DME Arranged:    DME Agency:     HH Arranged:    HH Agency:     Status of Service:  In process, will continue to follow  Medicare Important Message Given:    Date Medicare IM Given:    Medicare IM give by:    Date Additional Medicare IM Given:    Additional Medicare Important Message give by:     If discussed at Long Length of Stay Meetings, dates discussed:    Additional Comments:  [name], [name], RN 08/12/2015, 11:16 AM

## 2015-08-12 NOTE — ED Notes (Signed)
Pt was just discharged from hospital, unable to get medications filled. Pt saying he has 3 broke ribs, leg pain bilateral that's giving out.

## 2015-08-12 NOTE — Care Management Note (Signed)
Case Management Note  Patient Details  Name: Charlestine NightDonald R Dworkin MRN: [id number] Date of Birth: [birth year]  Subjective/Objective:    Spoke withpatient for discharge planning. Patient stated that he is homeless and [place] in [place]. Patient stated that he receives $159 dollars a month in food stamps, No other income.  No transportation and stated that he cannot get to DSS or Health department due to having to walk and he is unable to walk that far.  Patient stated that he has a girlfriend that receives $700 a month and is homeless as well.  Patient is agreeable to homeless shelter at discharge. Patient stated he would not be able to afford $3 prescriptions.  CSW is following case as well.          Action/Plan: Homeless shelter Health department.     Expected Discharge Date:                  Expected Discharge Plan:  Homeless Shelter  In-House Referral:  Clinical Social Work  Discharge planning Services  CM Consult, [facility]igent [facility] Clinic  Post Acute Care Choice:    Choice offered to:     DME Arranged:    DME Agency:     HH Arranged:    HH Agency:     Status of Service:  In process, will continue to follow  Medicare Important Message Given:    Date Medicare IM Given:    Medicare IM give by:    Date Additional Medicare IM Given:    Additional Medicare Important Message give by:     If discussed at Long Length of Stay Meetings, dates discussed:    Additional Comments:  [name], Abdulrahman Bracey L, RN 08/12/2015, 12:00 PM

## 2015-08-12 NOTE — Clinical Social Work Note (Signed)
Clinical Social Work Assessment  Patient Details  Name: Matthew Brady MRN: [id number] Date of Birth: 1961/03/18  Date of referral:  08/12/15               Reason for consult:  Discharge Planning, Substance Use/ETOH Abuse                Permission sought to share information with:    Permission granted to share information::     Name::        Agency::     Relationship::     Contact Information:     Housing/Transportation Living arrangements for the past 2 months:  Homeless Source of Information:  Patient Patient Interpreter Needed:  None Criminal Activity/Legal Involvement Pertinent to Current Situation/Hospitalization:  No - Comment as needed Significant Relationships:  Significant Other, Parents Lives with:  Significant Other Do you feel safe going back to the place where you live?   (would prefer a shelter) Need for family participation in patient care:  No (Coment)  Care giving concerns:  Pt is requiring assist and is independent at baseline.    Social Worker assessment / plan:  CSW met with pt at bedside. Pt alert and oriented and reports that he has been homeless for the past 6 months and has been staying on the back porch of an abandoned house. He describes his best support as his mother who lives in [place], but she doesn't want anyone staying with her. Pt's girlfriend is also homeless. Pt receives food stamps and she is on disability. He reports this amount is not enough to pay any rent and utilities. Pt has no transport, but is very close to the store and can walk there. CSW discussed d/c plan and pt is aware of PT recommendations, but is not interested in placement. He would be open to a shelter if his girlfriend can go too. CSW provided shelter list. Per PT note yesterday, pt did not ambulate, but has been up to bathroom with tech today. Substance abuse was also discussed with pt. He admits to drinking 2-3 beers occasionally. CSW asked about outpatient treatment and pt  states, "That is not needed, I can stop on my own." CSW will sign off, but can be reconsulted if needed.   Employment status:  Unemployed Forensic scientist:  Self Pay (Medicaid Pending) PT Recommendations:  [place] / Referral to community resources:  Shelter  Patient/Family's Response to care:  Pt feels that he is being d/c too soon.   Patient/Family's Understanding of and Emotional Response to Diagnosis, Current Treatment, and Prognosis:  Pt is concerned about weakness, and understands plan is for d/c today.   Emotional Assessment Appearance:  Appears older than stated age Attitude/Demeanor/Rapport:  Other (Cooperative) Affect (typically observed):  Appropriate Orientation:  Oriented to Self, Oriented to Place, Oriented to  Time, Oriented to Situation Alcohol / Substance use:  Alcohol Use Psych involvement (Current and /or in the community):  No (Comment)  Discharge Needs  Concerns to be addressed:  Discharge Planning Concerns, Substance Abuse Concerns Readmission within the last 30 days:  No Current discharge risk:  Homeless Barriers to Discharge:  No Barriers Identified   Matthew Brady, [place] 08/12/2015, 12:43 PM [phone]

## 2015-08-12 NOTE — ED Notes (Signed)
Pt just relaesed from hospital a few hours ago. Pt is homeless, [facility] Returned back to the hospital. Still complaining of 3 broke ribs, SOB, & bilateral leg pain.

## 2015-08-12 NOTE — Care Management Note (Signed)
Case Management Note  Patient Details  Name: Matthew Brady MRN: [id number] Date of Birth: 01-03-1961  Subjective/Objective: Patient given list of homeless shelters by CSW . Patietn has already used his [employer] voucher for this year.  Will need 3 days fo Levaquin 750.  Gave patient coupon [facility]. Patient has money to pay for this.  Spoke with Dr Janna Archondiego who is willing to accept patient. Office closed this afternoon. Patient to come by on Monday to make follow up appointment.  Patient will use [employer] transport bus. And has money for this. Patient expressed appreciation for assistance.                  Action/Plan:   Expected Discharge Date:                  Expected Discharge Plan:  Homeless Shelter  In-House Referral:  Clinical Social Work  Discharge planning Services  CM Consult, Indigent Health Clinic  Post Acute Care Choice:    Choice offered to:     DME Arranged:    DME Agency:     HH Arranged:    HH Agency:     Status of Service:  In process, will continue to follow  Medicare Important Message Given:    Date Medicare IM Given:    Medicare IM give by:    Date Additional Medicare IM Given:    Additional Medicare Important Message give by:     If discussed at Long Length of Stay Meetings, dates discussed:    Additional Comments:  [name], Zalman Hull L, RN 08/12/2015, 1:22 PM

## 2015-08-12 NOTE — ED Provider Notes (Signed)
CSN: [id number]     Arrival date & time 08/12/15  2324 History  By signing my name below, I, [facility], attest that this documentation has been prepared under the direction and in the presence of Blane Ohara, MD. Electronically Signed: Randell Patient, ED Scribe. 08/13/2015. 12:24 AM.   Chief Complaint  Patient presents with  . Leg Pain   The history is provided by the patient. No language interpreter was used.  HPI Comments: Matthew Brady is a 55 y.o. male who presents to the Emergency Department complaining of constant, moderate BLE pain ongoing for the past a year. Pt states that he has been homeless for the past 5 months and was released from the hospital earlier today, and came to the ED tonight due to his pain returning to baseline. He notes that he has been unable to ambulate to the restroom causing him to urinate or defecate on himself. He reports numbness and weakness to his bilateral legs, CP from when his ribs were fractured 4 days ago. Per pt, he has been seen similar symptoms in the past and has an appointment with Dr. Janna Arch next week. He notes that he has tried to be seen at [facility] but has been unable to be seen at this facility because it is full. He states that he does drink ETOH most recently 5 days ago. Denies any other symptoms currently.  Past Medical History  Diagnosis Date  . Back pain   . Poor circulation of extremity [facility])    Past Surgical History  Procedure Laterality Date  . Back surgery    . Left ear Left 1998    operated on left ear, due to "a hole in ear"   No family history on file. Social History  Substance Use Topics  . Smoking status: Current Every Day Smoker -- 1.50 packs/day for 32 years    Types: Cigarettes  . Smokeless tobacco: None  . Alcohol Use: 21.6 oz/week    36 Cans of beer per week     Comment: quit over a month ago    Review of Systems  Cardiovascular: Positive for chest pain.  Musculoskeletal:  Positive for myalgias.  Neurological: Positive for weakness and numbness.  All other systems reviewed and are negative.   Allergies  Review of patient's allergies indicates no known allergies.  Home Medications   Prior to Admission medications   Medication Sig Start Date End Date Taking? Authorizing Provider  folic acid (FOLVITE) 1 MG tablet Take 1 tablet (1 mg total) by mouth daily. 08/12/15  Yes Rolly Salter, MD  levofloxacin (LEVAQUIN) 750 MG tablet Take 1 tablet (750 mg total) by mouth daily. 08/12/15  Yes Rolly Salter, MD  Multiple Vitamin (MULTIVITAMIN WITH MINERALS) TABS tablet Take 1 tablet by mouth daily. 08/12/15  Yes Rolly Salter, MD  naproxen (NAPROSYN) 500 MG tablet Take 1 tablet (500 mg total) by mouth 3 (three) times daily with meals. 08/12/15  Yes Rolly Salter, MD  thiamine 100 MG tablet Take 1 tablet (100 mg total) by mouth daily. 08/12/15  Yes Rolly Salter, MD   BP 128/87 mmHg  Pulse 110  Temp(Src) 97.6 F (36.4 C) (Oral)  Resp 20  Ht 5\' 11"  (1.803 m)  Wt 165 lb (74.844 kg)  BMI 23.02 kg/m2  SpO2 95% Physical Exam  Constitutional: He is oriented to person, place, and time. He appears well-developed and well-nourished. No distress.  HENT:  Head: Normocephalic and atraumatic.  Mouth/Throat: Oropharynx is clear and moist.  No obv facial droop.  Eyes: EOM are normal.  Poor vision bilaterally.  Neck: Normal range of motion.  Cardiovascular: Tachycardia present.   Pulmonary/Chest: Effort normal. No respiratory distress. He exhibits tenderness.  Anterior lung fields clear. Tenderness over the right anterior lateral ribs.   Abdominal: Soft. There is no tenderness.  Musculoskeletal: Normal range of motion. He exhibits tenderness.  4/5 strength with hip flexion bilaterally. 4/5 strength with knee flexion and extension bilaterally. 4/5 strength with great toe flexion and extension.   paraspinal L- and T-spine tenderness.  Neurological: He is alert and oriented to  person, place, and time. GCS eye subscore is 4. GCS verbal subscore is 5. GCS motor subscore is 6.  Reflex Scores:      Patellar reflexes are 1+ on the right side and 1+ on the left side.      Achilles reflexes are 1+ on the left side. Decreased sensation in BLE. No obvious arm drift. Patient has cautious gait. Patient needs mild assistance with ambulation which is chronic for him. Mild dysmetria bilateral, mild tremor intermittent. Patient has general weakness upper and lower extremities.  Skin: Skin is warm and dry.  Psychiatric: His behavior is normal.  Nursing note and vitals reviewed.   ED Course  Procedures (including critical care time)  DIAGNOSTIC STUDIES: Oxygen Saturation is 95% on RA, adequate by my interpretation.    COORDINATION OF CARE: 11:48 PM Discussed treatment plan with pt at bedside and pt agreed to plan.   Labs Review Labs Reviewed - No data to display  Imaging Review Ct Thoracic Spine Wo Contrast  08/13/2015  CLINICAL DATA:  Status post fall, with multiple known right rib fractures. Chronic bilateral leg pain and numbness. Shortness of breath. Initial encounter. EXAM: CT THORACIC SPINE WITHOUT CONTRAST TECHNIQUE: Multidetector CT imaging of the thoracic spine was performed without intravenous contrast administration. Multiplanar CT image reconstructions were also generated. COMPARISON:  Chest radiograph performed 08/10/2015 FINDINGS: There is no evidence of fracture or subluxation along the thoracic spine. Vertebral bodies demonstrate normal height and alignment. Intervertebral disc spaces are preserved, with minimal vacuum phenomenon noted at multiple levels along the thoracic spine. Scattered small anterior osteophytes are noted along the mid to lower thoracic spine. The bony foramina are grossly unremarkable in appearance. Known rib fractures are not characterized on provided images. Minimal right basilar atelectasis is noted. The lungs are otherwise clear. Diffuse  coronary artery calcifications are seen. The visualized portions of the abdomen are grossly unremarkable. Scattered calcification is seen along the proximal abdominal aorta. IMPRESSION: 1. No evidence of fracture or subluxation along the thoracic spine. 2. Minimal degenerative change along the mid to lower thoracic spine. 3. Minimal right basilar atelectasis noted. 4. Diffuse coronary artery calcifications seen. 5. Scattered calcification along the proximal abdominal aorta. Electronically Signed   By: Roanna Raider M.D.   On: 08/13/2015 01:43   Mr Lumbar Spine W Wo Contrast  08/11/2015  CLINICAL DATA:  Low back pain.  Fall 3 days ago.  Weakness. EXAM: MRI LUMBAR SPINE WITHOUT AND WITH CONTRAST TECHNIQUE: Multiplanar and multiecho pulse sequences of the lumbar spine were obtained without and with intravenous contrast. CONTRAST:  15mL MULTIHANCE GADOBENATE DIMEGLUMINE 529 MG/ML IV SOLN COMPARISON:  CT lumbar spine 08/10/2015, MRI of lumbar spine 04/19/2009 FINDINGS: Segmentation: Normal segmentation. Lowest disc space L5-S1. Accentuated lumbar lordosis. Alignment: Slight anterior slip L5-S1. Slight retrolisthesis L4-5. Mild retrolisthesis L1-2. Vertebrae: Negative for fracture or mass. Chronic bilateral  pars defects of L5, best seen on CT. Conus medullaris: Extends to the T12-L1 level and appears normal. Fatty infiltration of the filum terminale without tethered cord. Filum terminale measures approximately 3.5 mm in diameter. Paraspinal and other soft tissues: Paraspinous muscles are symmetric. No retroperitoneal mass or adenopathy. Disc levels: L1-2:  Mild disc bulging without spinal stenosis. L2-3:  Mild disc and facet degeneration L3-4:  Mild facet degeneration without stenosis L4-5: Mild disc degeneration and spurring. Disc bulging and spurring is greater on the left than the right. Small central disc protrusion. Mild facet degeneration. Mild spinal stenosis. Mild subarticular stenosis on the left. L5-S1:  Bilateral pars defects of L5 with slight anterior slip. No significant spinal or foraminal stenosis. No enhancing mass lesion is seen postcontrast administration. IMPRESSION: 1. Negative for fracture 2. 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 Fatty infiltration of the filum terminale, incidental finding. 4. Mild spinal stenosis at L4-5 with mild narrowing of the subarticular recess on the left due to disc and osteophyte. This appears chronic. Electronically Signed   By: Marlan Palauharles  Clark M.D.   On: 08/11/2015 07:44   I have personally reviewed and evaluated these images and lab results as part of my medical decision-making.   EKG Interpretation None      MDM   Final diagnoses:  Gait difficulty  Closed rib fracture, right, with routine healing, subsequent encounter   Patient with alcohol abuse history currently homeless presents after recent discharge today for persistent general weakness in bilateral leg pain. Patient's had this for over 6 months. Patient had an MRI of his lumbar spine, CAT scan, blood work and treatment for pneumonia in the hospital. Patient did see physical therapy and Matthew management. Records reviewed. Patient does admit this is not new. Patient is still drinking alcohol.   Hospitalist consulted to assist with reexamination to look for any changes. Patient is at his baseline, patient ambulating in the ER with minimal assistance. Patient does need a walker. Plan for observation in the ER and social work/Matthew manager in the morning to see for assistance for a walker.  CT scan of thoracic spine performed with recent falls, mild tenderness.  Pt observed in ED, plan for Matthew mgmt to assist with walker in am.  Pt has outpt fup and knows where the office is.   Results and differential diagnosis were discussed with the patient/parent/guardian. Xrays were independently reviewed by myself.  Close follow up outpatient was discussed,  comfortable with the plan.   Medications  thiamine (VITAMIN B-1) tablet 100 mg (100 mg Oral Given 08/13/15 0009)  levofloxacin (LEVAQUIN) tablet 750 mg (750 mg Oral Given 08/13/15 0009)  acetaminophen (TYLENOL) tablet 1,000 mg (1,000 mg Oral Given 08/13/15 0009)    Filed Vitals:   08/12/15 2336  BP: 128/87  Pulse: 110  Temp: 97.6 F (36.4 C)  TempSrc: Oral  Resp: 20  Height: 5\' 11"  (1.803 m)  Weight: 165 lb (74.844 kg)  SpO2: 95%    Final diagnoses:  Gait difficulty  Closed rib fracture, right, with routine healing, subsequent encounter      Blane OharaJoshua Delorus Langwell, MD 08/17/15 1542

## 2015-08-12 NOTE — Progress Notes (Signed)
Patient discharged with instructions, prescription, and care notes.  Verbalized understanding via teach back.  IV was removed and the site was WNL. Patient voiced no further complaints or concerns at the time of discharge.  Appointments scheduled per instructions.  Patient left the floor via w/c with staff and family in stable condition. 

## 2015-08-13 ENCOUNTER — Encounter (HOSPITAL_COMMUNITY): Payer: Self-pay | Admitting: *Deleted

## 2015-08-13 ENCOUNTER — Emergency Department (HOSPITAL_COMMUNITY)
Admission: EM | Admit: 2015-08-13 | Discharge: 2015-08-13 | Disposition: A | Discharge status: Home / Self Care | Payer: Self-pay | Attending: Emergency Medicine | Admitting: Emergency Medicine

## 2015-08-13 ENCOUNTER — Emergency Department (HOSPITAL_COMMUNITY): Payer: Self-pay

## 2015-08-13 DIAGNOSIS — F1010 Alcohol abuse, uncomplicated: Secondary | ICD-10-CM

## 2015-08-13 DIAGNOSIS — F17210 Nicotine dependence, cigarettes, uncomplicated: Secondary | ICD-10-CM | POA: Insufficient documentation

## 2015-08-13 DIAGNOSIS — M79605 Pain in left leg: Secondary | ICD-10-CM | POA: Insufficient documentation

## 2015-08-13 DIAGNOSIS — G8929 Other chronic pain: Secondary | ICD-10-CM | POA: Insufficient documentation

## 2015-08-13 DIAGNOSIS — Z5900 Homelessness unspecified: Secondary | ICD-10-CM

## 2015-08-13 DIAGNOSIS — M79606 Pain in leg, unspecified: Secondary | ICD-10-CM

## 2015-08-13 DIAGNOSIS — R531 Weakness: Secondary | ICD-10-CM

## 2015-08-13 DIAGNOSIS — M79604 Pain in right leg: Secondary | ICD-10-CM | POA: Insufficient documentation

## 2015-08-13 HISTORY — DX: Dorsalgia, unspecified: M54.9

## 2015-08-13 HISTORY — DX: Homelessness: Z59.0

## 2015-08-13 HISTORY — DX: Homelessness unspecified: Z59.00

## 2015-08-13 HISTORY — DX: Other chronic pain: G89.29

## 2015-08-13 HISTORY — DX: Alcohol abuse, uncomplicated: F10.10

## 2015-08-13 HISTORY — DX: Pain in leg, unspecified: M79.606

## 2015-08-13 LAB — CULTURE, BLOOD (ROUTINE X 2)

## 2015-08-13 NOTE — ED Notes (Signed)
Pt is aware that he is up for discharge. Pt has dressed himself and is waiting for discharge papers.

## 2015-08-13 NOTE — Discharge Instructions (Signed)
Community Resource Guide Social Services °The [employer]’s “211” is a great source of information about community services available.  Access by dialing 2-1-1 from anywhere in [place], or by website -  www.nc211.org.  ° °Other Local Resources (Updated 03/2015) ° °Social  °Services °  °Services °Offered  °Phone Number and Address  °Aging, Disability and Transit Services • In-home services °• Meals on wheels °• Community meal sites °• Transportation services °• Adult day health care °• Center for Active Retirement °• Family caregiver support services 336-394-1310 °[place], [place]  °[place] [facility] • Aging and adult services °• Children’s services °• Deaf-Blind services °• Disability services °• Guardianship °• Hearing loss (assistive technology, interpreters, etc.) °• Low-income services (health care, child care, housing, financial and nutrition assistance) °• Medicaid °• Pregnancy services °• Utility assistance °• Veteran’s services 336-570-6532 °[address] °[place], [place] [address] °  °[facility] • Assessment °• Care management °• Family education °• Information and referral [phone] °[address] °[place], [place] [address]  °[place] • Aging and adult services °• Children’s services °• Deaf-Blind services °• Disability services °• Guardianship °• Hearing loss (assistive technology, interpreters, etc.) °• Low-income services (health care, child care, housing, financial and nutrition assistance) °• Medicaid °• Pregnancy services °• Utility assistance °• Veteran’s services [phone] °[address] °[place], [place] [address]  °[place] [facility] • Aging and adult services °• Children’s services °• Deaf-Blind services °• Disability services °• Guardianship °• Hearing loss (assistive technology, interpreters, etc.) °• Low-income services (health care, child care, housing, financial and nutrition  assistance) °• Medicaid °• Pregnancy services °• Utility assistance °• Veteran’s services [phone] °[address] °[place], [place] [address]  °[place] [facility] • Aging and adult services °• Children’s services °• Deaf-Blind services °• Disability services °• Guardianship °• Hearing loss (assistive technology, interpreters, etc.) °• Low-income services (health care, child care, housing, financial and nutrition assistance) °• Medicaid °• Pregnancy services °• Utility assistance °• Veteran’s services [phone] °[address] °[place], [place] [address] °  °[facility] [facility] • Aging and adult services °• Children’s services °• Deaf-Blind services °• Disability services °• Guardianship °• Hearing loss (assistive technology, interpreters, etc.) °• Low-income services (health care, child care, housing, financial and nutrition assistance) °• Medicaid °• Pregnancy services °• Utility assistance °• Veteran’s services [phone] °[address], °[place], [place] [address]  °[place] [facility] • Aging and adult services °• Children’s services °• Deaf-Blind services °• Disability services °• Guardianship °• Hearing loss (assistive technology, interpreters, etc.) °• Low-income services (health care, child care, housing, financial and nutrition assistance) °• Medicaid °• Pregnancy services °• Utility assistance °• Veteran’s services [phone] °[address] °[place], [place] [address]  °[facility] [place] • Serves adults age 60 and over and their families °• Caregiver information °• Foster grandparents °• Grandparents Raising Grandchildren °• Mobile meals °• Refugee programs °• Retired & Senior Volunteer Program (RSVP) °• Rural Outreach °• Senior Center °• Seniors’ Health Insurance Information Program (SHIIP) °• Senior Wheels Medical Transportation Program °• SeniorLine °• Support Groups °• TeleCare  [phone] ° °[address] °[place], [place] [address] ° °336-884-6981 ° °[address] °[place], [place] [address]  °[facility] • Help Line °• Home Care °• Living at Home °• Meals on Wheels °• Senior Lunch Program °• Adult Day Center °• Equipment Loan Closet °•   Support Groups °• Care Partner Workshops °• Referrals for chore and homemaker services 336-724-2040 ° °[address] °[place], [place] [address]  °[employer] • Crisis assistance °• Medication assistance °• Rental assistance °• Food pantry °• Medication assistance °• Housing assistance °• Emergency food distribution °• Utility assistance °• [employer] [phone] °[address] °[place], [place] ° ° °[phone] (Tuesdays and Thursdays from 9am - 12 noon by appointment only) °[address] °[place], [place] [address] ° °[phone] °[address] °[place], [place] [address]  ° ° ° °

## 2015-08-13 NOTE — Consult Note (Signed)
History and Physical    Matthew NightDonald R Brady ZOX:[id number] DOB: 02/15/1961 DOA: 08/12/2015  Referring MD/NP/PA: Blane OharaJoshua Zavitz, MD PCP: [name] Patient  Outpatient Specialists: Patient coming from: home  Chief Complaint: bilateral leg weakness  HPI: Matthew NightDonald R Brady is a 55 y.o. male who [facility], but returned to the hospital because he was unable to get his medications filled. He has a medical history significant of tobacco and alcohol abuse and presented with complaints of generalized bilateral leg weakness, SOB, and closed rib fractures. [facility] [place] [facility]. While in the ED, bloodwork showed that his creatinine levels were low at 0.49. His hgb was also mildly decreased at 11.3.  Pt was able to ambulate to the restroom with only minimal assistance via Dr. Conley RollsLe. He did not appear to be trying very hard during the exam. He displayed adequate leg strength, but appeared slightly unsteady. Hospitalist was asked to refer for admission as it was thought that he was not able to ambulate.  He was able to ambulate 120 feet with minimal assistance.   Review of Systems: As per HPI otherwise 10 point review of systems negative.   Past Medical History  Diagnosis Date  . Back pain   . Poor circulation of extremity [facility](HCC)     Past Surgical History  Procedure Laterality Date  . Back surgery    . Left ear Left 1998    operated on left ear, due to "a hole in ear"     reports that he has been smoking Cigarettes. He has a 48 pack-year smoking history. He does not have any smokeless tobacco history on file. He reports that he drinks about 21.6 oz of alcohol per week. He reports that he does not use illicit drugs.  No Known Allergies  No family history on file.   Prior to Admission medications   Medication Sig Start Date End Date Taking? Authorizing Provider  folic acid (FOLVITE) 1 MG tablet Take 1 tablet  (1 mg total) by mouth daily. 08/12/15  Yes Rolly SalterPranav M Patel, MD  levofloxacin (LEVAQUIN) 750 MG tablet Take 1 tablet (750 mg total) by mouth daily. 08/12/15  Yes Rolly SalterPranav M Patel, MD  Multiple Vitamin (MULTIVITAMIN WITH MINERALS) TABS tablet Take 1 tablet by mouth daily. 08/12/15  Yes Rolly SalterPranav M Patel, MD  naproxen (NAPROSYN) 500 MG tablet Take 1 tablet (500 mg total) by mouth 3 (three) times daily with meals. 08/12/15  Yes Rolly SalterPranav M Patel, MD  thiamine 100 MG tablet Take 1 tablet (100 mg total) by mouth daily. 08/12/15  Yes Rolly SalterPranav M Patel, MD   Physical Exam: Filed Vitals:   08/12/15 2336  BP: 128/87  Pulse: 110  Temp: 97.6 F (36.4 C)  TempSrc: Oral  Resp: 20  Height: 5\' 11"  (1.803 m)  Weight: 74.844 kg (165 lb)  SpO2: 95%   Constitutional: NAD, calm, comfortable Filed Vitals:   08/12/15 2336  BP: 128/87  Pulse: 110  Temp: 97.6 F (36.4 C)  TempSrc: Oral  Resp: 20  Height: 5\' 11"  (1.803 m)  Weight: 74.844 kg (165 lb)  SpO2: 95%   Eyes: PERRL, lids and conjunctivae normal ENMT: Mucous membranes are moist. Posterior pharynx clear of any exudate or lesions.Normal dentition.  Neck: normal, supple, no masses, no thyromegaly Respiratory: clear to auscultation bilaterally, no wheezing, no crackles. Normal respiratory effort. No accessory muscle use.  Cardiovascular: Regular rate and rhythm, no murmurs / rubs / gallops.  No extremity edema. 2+ pedal pulses. No carotid bruits.  Abdomen: no tenderness, no masses palpated. No hepatosplenomegaly. Bowel sounds positive.  Musculoskeletal: no clubbing / cyanosis. No joint deformity upper and lower extremities. Good ROM, no contractures. Normal muscle tone.  Skin: no rashes, lesions, ulcers. No induration Neurologic: CN 2-12 grossly intact. Sensation intact, DTR normal. Strength 5/5 in all 4. Psychiatric: Normal judgment and insight. Alert and oriented x 3. Normal mood.   Labs on  Admission: I have personally reviewed following labs and imaging studies  CBC:  Last Labs      Recent Labs Lab 08/10/15 2120 08/11/15 0539 08/12/15 0552  WBC 11.5* 6.3 4.6  NEUTROABS 8.8* --  2.5  HGB 13.9 12.0* 11.3*  HCT 39.2 33.9* 32.8*  MCV 100.5* 98.8 100.3*  PLT 166 149* 163     Basic Metabolic Panel:  Last Labs      Recent Labs Lab 08/10/15 2120 08/11/15 0539 08/12/15 0552 08/12/15 0651  NA 133* 133* 140 --   K 3.5 3.1* 3.6 --   CL 97* 102 112* --   CO2 19* 23 23 --   GLUCOSE 77 104* 113* --   BUN 15 12 8  --   CREATININE 0.90 0.60* 0.49* --   CALCIUM 8.6* 7.9* 8.0* --   MG --  --  1.6* --   PHOS --  --  --  3.2     GFR: Estimated Creatinine Clearance: 113 mL/min (by C-G formula based on Cr of 0.49). Liver Function Tests:  Last Labs      Recent Labs Lab 08/10/15 2120 08/12/15 0552  AST 54* 24  ALT 29 17  ALKPHOS 143* 89  BILITOT 2.8* 0.7  PROT 8.1 5.7*  ALBUMIN 3.9 2.7*       Labs (Brief)       Component Value Date/Time   COLORURINE AMBER* 08/11/2015 0017   APPEARANCEUR CLEAR 08/11/2015 0017   LABSPEC >1.030* 08/11/2015 0017   PHURINE 6.0 08/11/2015 0017   GLUCOSEU NEGATIVE 08/11/2015 0017   HGBUR NEGATIVE 08/11/2015 0017   BILIRUBINUR MODERATE* 08/11/2015 0017   KETONESUR >80* 08/11/2015 0017   PROTEINUR 30* 08/11/2015 0017   UROBILINOGEN 0.2 07/26/2012 1732   NITRITE NEGATIVE 08/11/2015 0017   LEUKOCYTESUR NEGATIVE 08/11/2015 0017     Sepsis Labs: @LABRCNTIP (procalcitonin:4,lacticidven:4) ) Recent Results (from the past 240 hour(s))  Blood culture (routine x 2) Status: None (Preliminary result)   Collection Time: 08/10/15 10:24 PM  Result Value Ref Range Status   Specimen Description RIGHT ANTECUBITAL  Final   Special Requests BOTTLES  DRAWN AEROBIC AND ANAEROBIC 6CC  Final   Culture NO GROWTH 2 DAYS  Final   Report Status PENDING  Incomplete  Blood culture (routine x 2) Status: Abnormal (Preliminary result)   Collection Time: 08/10/15 10:25 PM  Result Value Ref Range Status   Specimen Description BLOOD LEFT ARM  Final   Special Requests BOTTLES DRAWN AEROBIC AND ANAEROBIC 6CC  Final   Culture Setup Time   Final    GRAM POSITIVE COCCI IN CLUSTERS RECOVERED FROM THE AEROBIC BOTTLE Gram Stain Report Called to,Read Back By and Verified With: HAWKINS,M AT 1317 ON 08/11/2015 BY BAUGHAM,M. Performed at [facility] CRITICAL RESULT CALLED TO, READ BACK BY AND VERIFIED WITH: Shon Hale HAWKINS RN 08/11/2015 1830 BEAMJ Performed at [facility]    Culture STAPHYLOCOCCUS SPECIES (COAGULASE NEGATIVE) (A)  Final   Report Status PENDING  Incomplete  Blood Culture ID Panel (Reflexed) Status: Abnormal  Collection Time: 08/10/15 10:25 PM  Result Value Ref Range Status   Enterococcus species NOT DETECTED NOT DETECTED Final   Vancomycin resistance NOT DETECTED NOT DETECTED Final   Listeria monocytogenes NOT DETECTED NOT DETECTED Final   Staphylococcus species DETECTED (A) NOT DETECTED Final    Comment: CRITICAL RESULT CALLED TO, READ BACK BY AND VERIFIED WITH: MARY BETH HAWKINS RN 08/11/2015 1830 BEAMJ    Staphylococcus aureus NOT DETECTED NOT DETECTED Final   Methicillin resistance DETECTED (A) NOT DETECTED Final    Comment: CRITICAL RESULT CALLED TO, READ BACK BY AND VERIFIED WITH: MARY BETH HAWKINS RN 08/11/2015 1830 BEAMJ    Streptococcus species NOT DETECTED NOT DETECTED Final   Streptococcus agalactiae NOT DETECTED NOT DETECTED Final   Streptococcus pneumoniae NOT DETECTED NOT DETECTED Final   Streptococcus pyogenes NOT DETECTED NOT DETECTED Final   Acinetobacter baumannii NOT DETECTED NOT  DETECTED Final   Enterobacteriaceae species NOT DETECTED NOT DETECTED Final   Enterobacter cloacae complex NOT DETECTED NOT DETECTED Final   Escherichia coli NOT DETECTED NOT DETECTED Final   Klebsiella oxytoca NOT DETECTED NOT DETECTED Final   Klebsiella pneumoniae NOT DETECTED NOT DETECTED Final   Proteus species NOT DETECTED NOT DETECTED Final   Serratia marcescens NOT DETECTED NOT DETECTED Final   Carbapenem resistance NOT DETECTED NOT DETECTED Final   Haemophilus influenzae NOT DETECTED NOT DETECTED Final   Neisseria meningitidis NOT DETECTED NOT DETECTED Final   Pseudomonas aeruginosa NOT DETECTED NOT DETECTED Final   Candida albicans NOT DETECTED NOT DETECTED Final   Candida glabrata NOT DETECTED NOT DETECTED Final   Candida krusei NOT DETECTED NOT DETECTED Final   Candida parapsilosis NOT DETECTED NOT DETECTED Final   Candida tropicalis NOT DETECTED NOT DETECTED Final    Comment: Performed at [facility]  Culture, blood (routine x 2) Status: None (Preliminary result)   Collection Time: 08/11/15 7:45 PM  Result Value Ref Range Status   Specimen Description RIGHT ANTECUBITAL  Final   Special Requests BOTTLES DRAWN AEROBIC AND ANAEROBIC 6CC  Final   Culture NO GROWTH < 12 HOURS  Final   Report Status PENDING  Incomplete  Culture, blood (routine x 2) Status: None (Preliminary result)   Collection Time: 08/11/15 8:00 PM  Result Value Ref Range Status   Specimen Description BLOOD RIGHT HAND  Final   Special Requests BOTTLES DRAWN AEROBIC AND ANAEROBIC 6CC  Final   Culture NO GROWTH < 12 HOURS  Final   Report Status PENDING  Incomplete     Radiological Exams on Admission:  Imaging Results (Last 48 hours)    Mr Lumbar Spine W Wo Contrast  08/11/2015 CLINICAL DATA: Low back pain. Fall 3 days ago. Weakness. EXAM: MRI  LUMBAR SPINE WITHOUT AND WITH CONTRAST TECHNIQUE: Multiplanar and multiecho pulse sequences of the lumbar spine were obtained without and with intravenous contrast. CONTRAST: 15mL MULTIHANCE GADOBENATE DIMEGLUMINE 529 MG/ML IV SOLN COMPARISON: CT lumbar spine 08/10/2015, MRI of lumbar spine 04/19/2009 FINDINGS: Segmentation: Normal segmentation. Lowest disc space L5-S1. Accentuated lumbar lordosis. Alignment: Slight anterior slip L5-S1. Slight retrolisthesis L4-5. Mild retrolisthesis L1-2. Vertebrae: Negative for fracture or mass. Chronic bilateral pars defects of L5, best seen on CT. Conus medullaris: Extends to the T12-L1 level and appears normal. Fatty infiltration of the filum terminale without tethered cord. Filum terminale measures approximately 3.5 mm in diameter. Paraspinal and other soft tissues: Paraspinous muscles are symmetric. No retroperitoneal mass or adenopathy. Disc levels: L1-2: Mild disc bulging without spinal stenosis. L2-3: Mild  disc and facet degeneration L3-4: Mild facet degeneration without stenosis L4-5: Mild disc degeneration and spurring. Disc bulging and spurring is greater on the left than the right. Small central disc protrusion. Mild facet degeneration. Mild spinal stenosis. Mild subarticular stenosis on the left. L5-S1: Bilateral pars defects of L5 with slight anterior slip. No significant spinal or foraminal stenosis. No enhancing mass lesion is seen postcontrast administration. IMPRESSION: 1. Negative for fracture 2. 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 Fatty infiltration of the filum terminale, incidental finding. 4. Mild spinal stenosis at L4-5 with mild narrowing of the subarticular recess on the left due to disc and osteophyte. This appears chronic. Electronically Signed By: Marlan Palau M.D. On: 08/11/2015 07:44     EKG: Independently reviewed.   Assessment/Plan  1. Generalized bilateral leg weakness. I was able  to ambulate him without much assistance.  I think a CT of the thoracic is prudent, but will be low yield given he was able to ambulate for me.  He doesn't need hospitalization, but I think he needs a significicant amount of support.   He did not want to be placed in the SNF, and he would not qualify according to [facility] anyhow.  If he can be provided with a four point walker, it would be safer for him.  He doesn't require inpatient service, and I appreciate the consultation.  2. CAP. Continue with oral Levaquin.  3. Fracture of ribs. Due to mechanical fall.    Houston Siren, MD FACP [facility]  If 7PM-7AM, please contact Brady-coverage [email or web address] Password [email or web address]  08/13/2015, 12:21 AM   By signing my name below, I, Adron Bene, attest that this documentation has been prepared under the direction and in the presence of Houston Siren, MD. Electronically Signed: Adron Bene, Scribe 08/13/2015 12:21am

## 2015-08-13 NOTE — ED Notes (Signed)
Pt given a drink at this time  

## 2015-08-13 NOTE — Discharge Instructions (Signed)
If you were given medicines take as directed.  If you are on coumadin or contraceptives realize their levels and effectiveness is altered by many different medicines.  If you have any reaction (rash, tongues swelling, other) to the medicines stop taking and see a physician.    If your blood pressure was elevated in the ER make sure you follow up for management with a primary doctor or return for chest pain, shortness of breath or stroke symptoms.  Please follow up as directed and return to the ER or see a physician for new or worsening symptoms.  Thank you. Filed Vitals:   08/12/15 2336  BP: 128/87  Pulse: 110  Temp: 97.6 F (36.4 C)  TempSrc: Oral  Resp: 20  Height: 5\' 11"  (1.803 m)  Weight: 165 lb (74.844 kg)  SpO2: 95%   Follow up with your family md

## 2015-08-13 NOTE — ED Provider Notes (Signed)
CSN: [id number]     Arrival date & time 08/13/15  1959 History   First MD Initiated Contact with Patient 08/13/15 2014     Chief Complaint  Patient presents with  . Leg Pain      HPI Pt was seen at 2015. Per EMS and pt report, c/o gradual onset and persistence of constant acute flair of his chronic legs "pain" for the past several years "since my back surgery a while ago." EMS states on their arrival to scene, pt was walking on his own without difficulty. Pt denies any change in his usual chronic legs "pain." Denies injury, no focal motor weakness, no tingling/numbness in extremities, no change in his usual chronic back pain, no abd pain, no fevers. The symptoms have been associated with no other complaints. The patient has a significant history of similar symptoms previously, recently being evaluated for this complaint and multiple prior evals for same.      Past Medical History  Diagnosis Date  . Poor circulation of extremity (HCC)   . Chronic leg pain   . Chronic back pain   . Homelessness   . Alcohol abuse    Past Surgical History  Procedure Laterality Date  . Back surgery    . Left ear Left 1998    operated on left ear, due to "a hole in ear"    Social History  Substance Use Topics  . Smoking status: Current Every Day Smoker -- 1.50 packs/day for 32 years    Types: Cigarettes  . Smokeless tobacco: None  . Alcohol Use: 21.6 oz/week    36 Cans of beer per week     Comment: quit over a month ago    Review of Systems ROS: Statement: All systems negative except as marked or noted in the HPI; Constitutional: Negative for fever and chills. ; ; Eyes: Negative for eye pain, redness and discharge. ; ; ENMT: Negative for ear pain, hoarseness, nasal congestion, sinus pressure and sore throat. ; ; Cardiovascular: Negative for chest pain, palpitations, diaphoresis, dyspnea and peripheral edema. ; ; Respiratory: Negative for cough, wheezing and stridor. ; ; Gastrointestinal: Negative  for nausea, vomiting, diarrhea, abdominal pain, blood in stool, hematemesis, jaundice and rectal bleeding. . ; ; Genitourinary: Negative for dysuria, flank pain and hematuria. ; ; Musculoskeletal: +chronic legs "pains." Negative for back pain and neck pain. Negative for swelling and trauma.; ; Skin: Negative for pruritus, rash, abrasions, blisters, bruising and skin lesion.; ; Neuro: Negative for headache, lightheadedness and neck stiffness. Negative for weakness, altered level of consciousness, altered mental status, extremity weakness, paresthesias, involuntary movement, seizure and syncope.      Allergies  Review of patient's allergies indicates no known allergies.  Home Medications   Prior to Admission medications   Medication Sig Start Date End Date Taking? Authorizing Provider  folic acid (FOLVITE) 1 MG tablet Take 1 tablet (1 mg total) by mouth daily. Patient not taking: Reported on 08/13/2015 08/12/15   Rolly SalterPranav M Patel, MD  levofloxacin (LEVAQUIN) 750 MG tablet Take 1 tablet (750 mg total) by mouth daily. Patient not taking: Reported on 08/13/2015 08/12/15   Rolly SalterPranav M Patel, MD  Multiple Vitamin (MULTIVITAMIN WITH MINERALS) TABS tablet Take 1 tablet by mouth daily. Patient not taking: Reported on 08/13/2015 08/12/15   Rolly SalterPranav M Patel, MD  naproxen (NAPROSYN) 500 MG tablet Take 1 tablet (500 mg total) by mouth 3 (three) times daily with meals. Patient not taking: Reported on 08/13/2015 08/12/15   Zachery ConchPranav M  Allena Katz, MD  thiamine 100 MG tablet Take 1 tablet (100 mg total) by mouth daily. Patient not taking: Reported on 08/13/2015 08/12/15   Rolly Salter, MD   BP 114/67 mmHg  Pulse 108  Temp(Src) 97.8 F (36.6 C) (Oral)  Resp 19  Ht  (1.803 m)  Wt 165 lb (74.844 kg)  BMI 23.02 kg/m2  SpO2 95% Physical Exam  2020: Physical examination:  Nursing notes reviewed; Vital signs and O2 SAT reviewed;  Constitutional: Well developed, Well nourished, Well hydrated, In no acute distress; Head:   Normocephalic, atraumatic; Eyes: EOMI, PERRL, No scleral icterus; ENMT: Mouth and pharynx normal, Mucous membranes moist; Neck: Supple, Full range of motion, No lymphadenopathy; Cardiovascular: Regular rate and rhythm, No gallop; Respiratory: Breath sounds clear & equal bilaterally, No wheezes.  Speaking full sentences with ease, Normal respiratory effort/excursion; Chest: Nontender, Movement normal; Abdomen: Soft, Nontender, Nondistended, Normal bowel sounds;; Extremities: Pulses normal, No tenderness, No edema, No calf edema or asymmetry.; Neuro: AA&Ox3, Major CN grossly intact.  Speech clear. No gross focal motor or sensory deficits in extremities. Climbs on and off stretcher easily by himself. Gait steady.; Skin: Color normal, Warm, Dry.; Psych:  Affect flat, poor eye contact.    ED Course  Procedures (including critical care time) Labs Review   Imaging Review  I have personally reviewed and evaluated these images and lab results as part of my medical decision-making.   EKG Interpretation None      MDM  MDM Reviewed: previous chart, nursing note and vitals Reviewed previous: labs, x-ray and CT scan     2030:  Pt was extensively evaluated as an inpatient, then after being discharged, returned to the ED within several hours for another extensive evaluation; including MRI TS/LS, PT eval and [facility] eval. Pt has again returned to the ED for this same complaint after being discharged several hours ago. Pt is homeless and denies any new or changed complaint other than his chronic pain. Pt again given outpatient community resources and strongly encouraged to use them. Pt verb understanding. Pt climbed off the stretcher easily on his own, got himself dressed, and is walking around his exam room with steady gait, easy resps, NAD.     Samuel Jester, DO 08/15/15 2005

## 2015-08-13 NOTE — ED Notes (Signed)
PT consult completed.  Pt ambulating without difficulty in department.  Family at bedside to take pt home.

## 2015-08-13 NOTE — ED Notes (Signed)
EDP in with patient 

## 2015-08-13 NOTE — ED Notes (Signed)
Pt standing & ambulating in the hallway w/ hospitalist at pt side. Pt returned to room w/ no complications.

## 2015-08-13 NOTE — ED Notes (Signed)
Pt ambulated to restroom & returned to room w/ no complications. 

## 2015-08-13 NOTE — Evaluation (Signed)
Physical Therapy Evaluation Patient Details Name: Matthew Brady MRN: [id number] DOB: 18-May-1960 Today's Date: 08/13/2015   History of Present Illness   Pt is a 54yo M currently in the ED after c/o constant BLE pain. He is homeless, however reports daily going up/down 3 steps into a home. States he falls frequently when he gets dizzy or when going up/down the previously mentioned steps without a handrail. He notes that he has recently started scooting up and down the steps to increase his safety. He reports no pain currently other than along his Rt rib cage which he reports breaking 3 ribs. Also notes occasional numbness and tingling in BLE along the calf region. Denies any other symptoms or issues at this time.  Clinical Impression  PT evaluated pt in ED regarding the need for a RW. Examination revealed 4+/5 strength throughout BUE/BLE. Pt demonstrating independence with all bed mobility and transfers at this time. Reporting no pain currently other than Rt rib pain with RUE strength testing. Pt was able to ambulate through ED with supervision for ~61ft without AD and no noted postural sway or LOB. Also used RW during ambulation with no noted improvement in gait pattern/steadiness. In addition, pt reporting he felt no different when using the RW and is concerned the bottoms might wear off when using outside. At this time, although he does demonstrate some weakness and balance impairments, the walker would not be beneficial to him seeing that his primary issue is with ascending/desceding steps without handrails, in particular the steps he claims to use daily. Therapist discussed safety with the pt regarding using steps and encouraged him to scoot up and down the steps to avoid any risk of falls and injury. Also encouraged pt to practice while in the ED, however he declined stating he was able to do this already. Therapist notified RN of the results of the evaluation.    Follow Up Recommendations  None at  this time    Equipment Recommendations    None   Recommendations for Other Services   None    Precautions / Restrictions   None     Mobility  Bed Mobility  Independent with bed mobility                 Transfers  Independent: noting almost LOBx1 backwards onto the bed due to quickly standing to go for a walk.                  Ambulation/Gait    Ambulating through hallway with supervision x89ft with RW. Ambulating 46ft without AD and CGA.  Noting minimal postural sway and pt able to maneuver turns, etc. Without noted LOB.            Stairs  Not tested, pt verbalizing confidence and understanding of correct technique to scoot up/down steps without handrails.          Wheelchair Mobility    Modified Rankin (Stroke Patients Only)       Balance    Standing functional reach 5-7 inches without LOB noted. Otherwise Fair (+) balance noted throughout session.          Pertinent Vitals/Pain  No pain currently    Home Living   Per medical records, pt is homeless. Unable to get a clear picture from the pt as he kept mentioning going up/down 3 steps every day to get into the "house".       Prior Function  Modified independent  Extremity/Trunk Assessment    Grossly 4+/5 throughout UE/LE via MMT. Noting shaking during MMT of LE.        Communication    WNL  Cognition  WNL          Exercises  Educated pt regarding taking breaks when he feels fatigued and sitting down during any dizzy spells. Also discussed the importance of scooting up/down steps to prevent any LOB and risk of injury.     Time:  - 10:25AM-10:50AM      12:00 PM,08/13/2015 Marylyn IshiharaSara Kiser PT, DPT [facility] [phone]

## 2015-08-13 NOTE — ED Notes (Addendum)
Pt was picked up by [facility] with c/o continued leg pain. EMS states pt was walking on his on, walked to the EMS with no problems. Pt here multiple times for the same. Pt stating "they are going to have to do something for my legs."

## 2015-08-14 NOTE — Discharge Summary (Signed)
[facility] Discharge Summary   Patient: Matthew Brady WJX:[id number]   PCP: [name] Patient DOB: 1960/05/09   Date of admission: 08/10/2015   Date of discharge: 08/12/2015     Discharge Diagnoses:  Principal Problem:   CAP (community acquired pneumonia) Active Problems:   Closed rib fracture   Weakness generalized   Back pain  Recommendations for Outpatient Follow-up:  1. Establish care with PCP in follow-up in one week for generalized weakness   Follow-up Information    Schedule an appointment as soon as possible for a visit in 1 week to follow up.   Why:  with PCP     Diet recommendation: Regular diet  Activity: The patient is advised to gradually reintroduce usual activities.  Discharge Condition: Stable  History of present illness: As per the H and P dictated on admission, "Matthew Brady is an 55 y.o. male with hx of lower back pain, prior back surgery, hx of prior alcohol abuse, current tobacco use, homeless, PVD, presented to the ER as he fell yesterday, and having chest pain. He noted to have intermittent leg weakness bilaterally, and over the past week, he had trouble walking. He denied urinary symptoms, nausea, vomiting, fever, or chills. He had some nonproductive coughs. In the ER, he was not able to ambulate. Work up included an LS spine CT, which showed no compression, prior surgery, and no other abnormality. His Rib films showed Fx 4 and 5th right ribs. His head CT was negative. Serology showed no leukocytosis, and his renal fx test was normal. Hospitalist was asked to admit him for CAP, Fx rib, and inability to ambulate in a patient who is also homeless"  Hospital Course:  Summary of his active problems in the hospital is as following.  1. CAP (community acquired pneumonia) Left lung base pneumonia. Right 4 and fifth rib fracture. One out of 2 blood culture positive for coagulase-negative staph-contamination. Repeat cultures negative. Urine has  evidence of ketones, elevated total bilirubin level. Leukocytosis resolved. Add incentive spirometry. Continue naproxen as well as levofloxacin on discharge.  2. Tremors. Patient denies any alcohol abuse alcohol level are also undetectable. Patient has significant tremors with ketones positive in the urine. Concerning for undiagnosed alcoholic withdrawal. I'll continue patient on CIWA protocol. Check UDS.   3. Back pain. Generalized weakness. Patient mentions this has been present for last 1 year and is not in acute complaint. CT and MRI spine negative for any acute abnormality. Patient was able to ambulate without any support as per RN. Social worker and Sports coach consulted. Please see their evaluation note.  All other chronic medical condition were stable during the hospitalization.  Patient was seen by physical therapy, who recommended SNF. Although patient would not be able to placed and home health would not be able to come to shelter. Patient after initial evaluation of PT was able to ambulate with her restroom and back as per RN without any difficulty.  On the day of the discharge the patient's vitals were stable, and no other acute medical condition were reported by patient. the patient was felt safe to be discharge at home with family.  Procedures and Results:  None   Consultations:  None  DISCHARGE MEDICATION: Discharge Medication List as of 08/12/2015  5:15 PM    START taking these medications   Details  folic acid (FOLVITE) 1 MG tablet Take 1 tablet (1 mg total) by mouth daily., Starting 08/12/2015, Until Discontinued, Normal    levofloxacin (  LEVAQUIN) 750 MG tablet Take 1 tablet (750 mg total) by mouth daily., Starting 08/12/2015, Until Discontinued, Normal    Multiple Vitamin (MULTIVITAMIN WITH MINERALS) TABS tablet Take 1 tablet by mouth daily., Starting 08/12/2015, Until Discontinued, Normal    naproxen (NAPROSYN) 500 MG tablet Take 1 tablet (500 mg total)  by mouth 3 (three) times daily with meals., Starting 08/12/2015, Until Discontinued, Normal    thiamine 100 MG tablet Take 1 tablet (100 mg total) by mouth daily., Starting 08/12/2015, Until Discontinued, Normal       No Known Allergies Discharge Instructions    Diet general    Complete by:  As directed      Discharge instructions    Complete by:  As directed   It is important that you read following instructions as well as go over your medication list with RN to help you understand your care after this hospitalization.  Discharge Instructions: Please follow-up with PCP in one week  Please request your primary care physician to go over all Hospital Tests and Procedure/Radiological results at the follow up,  Please get all Hospital records sent to your PCP by signing hospital release before you go home.   Do not drive, operating heavy machinery, perform activities at heights, swimming or participation in water activities or provide baby sitting services ; until you have been seen by Primary Care Physician or a Neurologist and advised to do so again. Do not take more than prescribed Pain, Sleep and Anxiety Medications. You were cared for by a hospitalist during your hospital stay. If you have any questions about your discharge medications or the care you received while you were in the hospital after you are discharged, you can call the unit and ask to speak with the hospitalist on call if the hospitalist that took care of you is not available.  Once you are discharged, your primary care physician will handle any further medical issues. Please note that NO REFILLS for any discharge medications will be authorized once you are discharged, as it is imperative that you return to your primary care physician (or establish a relationship with a primary care physician if you do not have one) for your aftercare needs so that they can reassess your need for medications and monitor your lab values. You Must  read complete instructions/literature along with all the possible adverse reactions/side effects for all the Medicines you take and that have been prescribed to you. Take any new Medicines after you have completely understood and accept all the possible adverse reactions/side effects. Wear Seat belts while driving. If you have smoked or chewed Tobacco in the last 2 yrs please stop smoking and/or stop any Recreational drug use.     Increase activity slowly    Complete by:  As directed           Discharge Exam: Filed Weights   08/10/15 1952 08/11/15 1124  Weight: 74.844 kg (165 lb) 74.844 kg (165 lb)   Filed Vitals:   08/12/15 0441 08/12/15 1308  BP: 131/80 139/83  Pulse: 83 81  Temp: 98.9 F (37.2 C) 99.5 F (37.5 C)  Resp: 18 18   General: Appear in no distress, no Rash; Oral Mucosa moist. Cardiovascular: S1 and S2 Present, no Murmur, no JVD Respiratory: Bilateral Air entry present and Clear to Auscultation, no Crackles, no wheezes Abdomen: Bowel Sound present, Soft and no tenderness Extremities: no Pedal edema, no calf tenderness Neurology: Grossly no focal neuro deficit.  The results of significant  diagnostics from this hospitalization (including imaging, microbiology, ancillary and laboratory) are listed below for reference.    Significant Diagnostic Studies: Dg Chest 2 View  08/10/2015  CLINICAL DATA:  Right anterior rib pain status post fall yesterday. EXAM: CHEST  2 VIEW COMPARISON:  Jul 26, 2012 FINDINGS: The heart size and mediastinal contours are within normal limits. There is patchy consolidation of left lung base. There is no pulmonary edema or pleural effusion. Degenerative joint changes of the spine are identified. IMPRESSION: Left lung base pneumonia. Please refer to the right rib series today for ribs findings. Electronically Signed   By: Sherian Rein M.D.   On: 08/10/2015 20:47   Dg Ribs Unilateral Right  08/10/2015  CLINICAL DATA:  Status post fall yesterday  with right rib pain. EXAM: RIGHT RIBS - 2 VIEW COMPARISON:  None. FINDINGS: There are fractures of the right lateral fourth and fifth ribs. There is no pneumothorax. IMPRESSION: Fractures of the right lateral fourth and fifth ribs. Electronically Signed   By: Sherian Rein M.D.   On: 08/10/2015 20:50   Ct Head Wo Contrast  08/10/2015  CLINICAL DATA:  Sudden onset and gradual worsening pain after fall yesterday. EXAM: CT HEAD WITHOUT CONTRAST TECHNIQUE: Contiguous axial images were obtained from the base of the skull through the vertex without intravenous contrast. COMPARISON:  03/19/2010 FINDINGS: The ventricles, cisterns and other CSF spaces are within normal. There is no mass, mass effect, shift of midline structures or acute hemorrhage. No evidence of acute infarction. Bony structures within normal. There is opacification of the frontal sinus and right maxillary sinus. IMPRESSION: No acute intracranial findings. 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 Electronically Signed   By: Elberta Fortis M.D.   On: 08/10/2015 22:43   Ct Thoracic Spine Wo Contrast  08/13/2015  CLINICAL DATA:  Status post fall, with multiple known right rib fractures. Chronic bilateral leg pain and numbness. Shortness of breath. Initial encounter. EXAM: CT THORACIC SPINE WITHOUT CONTRAST TECHNIQUE: Multidetector CT imaging of the thoracic spine was performed without intravenous contrast administration. Multiplanar CT image reconstructions were also generated. COMPARISON:  Chest radiograph performed 08/10/2015 FINDINGS: There is no evidence of fracture or subluxation along the thoracic spine. Vertebral bodies demonstrate normal height and alignment. Intervertebral disc spaces are preserved, with minimal vacuum phenomenon noted at multiple levels along the thoracic spine. Scattered small anterior osteophytes are noted along the mid to lower thoracic spine. The bony foramina are grossly  unremarkable in appearance. Known rib fractures are not characterized on provided images. Minimal right basilar atelectasis is noted. The lungs are otherwise clear. Diffuse coronary artery calcifications are seen. The visualized portions of the abdomen are grossly unremarkable. Scattered calcification is seen along the proximal abdominal aorta. IMPRESSION: 1. No evidence of fracture or subluxation along the thoracic spine. 2. Minimal degenerative change along the mid to lower thoracic spine. 3. Minimal right basilar atelectasis noted. 4. Diffuse coronary artery calcifications seen. 5. Scattered calcification along the proximal abdominal aorta. Electronically Signed   By: Roanna Raider M.D.   On: 08/13/2015 01:43   Ct Lumbar Spine Wo Contrast  08/10/2015  CLINICAL DATA:  Fall yesterday landing on a rock. Lumbosacral back pain. Unable to ambulate. Weakness. EXAM: CT LUMBAR SPINE WITHOUT CONTRAST TECHNIQUE: Multidetector CT imaging of the lumbar spine was performed without intravenous contrast administration. Multiplanar CT image reconstructions were also generated. COMPARISON:  Lumbar spine MRI 04/19/2009 FINDINGS: No acute fracture or subluxation. Vertebral body heights are  maintained, no compression deformity. Mild disc space narrowing with broad-based disc bulge. Detailed evaluation would be better assessed with MRI. No spinal canal narrowing. There is disc space narrowing at L1-L2 with broad-based disc bulge. Post L4 left laminectomy. Multilevel endplate spurring. No evidence of hemorrhage in the spinal canal. Sacroiliac joints are congruent. Paraspinal soft tissue evaluation demonstrates atherosclerosis of the abdominal aorta. IMPRESSION: Postsurgical and degenerative change in the lumbar spine. No acute abnormality. Electronically Signed   By: Rubye Oaks M.D.   On: 08/10/2015 22:52   Mr Lumbar Spine W Wo Contrast  08/11/2015  CLINICAL DATA:  Low back pain.  Fall 3 days ago.  Weakness. EXAM: MRI  LUMBAR SPINE WITHOUT AND WITH CONTRAST TECHNIQUE: Multiplanar and multiecho pulse sequences of the lumbar spine were obtained without and with intravenous contrast. CONTRAST:  15mL MULTIHANCE GADOBENATE DIMEGLUMINE 529 MG/ML IV SOLN COMPARISON:  CT lumbar spine 08/10/2015, MRI of lumbar spine 04/19/2009 FINDINGS: Segmentation: Normal segmentation. Lowest disc space L5-S1. Accentuated lumbar lordosis. Alignment: Slight anterior slip L5-S1. Slight retrolisthesis L4-5. Mild retrolisthesis L1-2. Vertebrae: Negative for fracture or mass. Chronic bilateral pars defects of L5, best seen on CT. Conus medullaris: Extends to the T12-L1 level and appears normal. Fatty infiltration of the filum terminale without tethered cord. Filum terminale measures approximately 3.5 mm in diameter. Paraspinal and other soft tissues: Paraspinous muscles are symmetric. No retroperitoneal mass or adenopathy. Disc levels: L1-2:  Mild disc bulging without spinal stenosis. L2-3:  Mild disc and facet degeneration L3-4:  Mild facet degeneration without stenosis L4-5: Mild disc degeneration and spurring. Disc bulging and spurring is greater on the left than the right. Small central disc protrusion. Mild facet degeneration. Mild spinal stenosis. Mild subarticular stenosis on the left. L5-S1: Bilateral pars defects of L5 with slight anterior slip. No significant spinal or foraminal stenosis. No enhancing mass lesion is seen postcontrast administration. IMPRESSION: 1. Negative for fracture 2. 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 Fatty infiltration of the filum terminale, incidental finding. 4. Mild spinal stenosis at L4-5 with mild narrowing of the subarticular recess on the left due to disc and osteophyte. This appears chronic. Electronically Signed   By: Marlan Palau M.D.   On: 08/11/2015 07:44    Microbiology: Recent Results (from the past 240 hour(s))  Blood culture (routine x 2)     Status: None  (Preliminary result)   Collection Time: 08/10/15 10:24 PM  Result Value Ref Range Status   Specimen Description RIGHT ANTECUBITAL  Final   Special Requests BOTTLES DRAWN AEROBIC AND ANAEROBIC 6CC  Final   Culture NO GROWTH 4 DAYS  Final   Report Status PENDING  Incomplete  Blood culture (routine x 2)     Status: Abnormal   Collection Time: 08/10/15 10:25 PM  Result Value Ref Range Status   Specimen Description BLOOD LEFT ARM  Final   Special Requests BOTTLES DRAWN AEROBIC AND ANAEROBIC 6CC  Final   Culture  Setup Time   Final    GRAM POSITIVE COCCI IN CLUSTERS RECOVERED FROM THE AEROBIC BOTTLE Gram Stain Report Called to,Read Back By and Verified With: HAWKINS,M AT 1317 ON 08/11/2015 BY BAUGHAM,M. Performed at [facility] CRITICAL RESULT CALLED TO, READ BACK BY AND VERIFIED WITH: MARY BETH HAWKINS RN 08/11/2015 1830 BEAMJ    Culture (A)  Final    STAPHYLOCOCCUS SPECIES (COAGULASE NEGATIVE) THE SIGNIFICANCE OF ISOLATING THIS ORGANISM FROM A SINGLE SET OF BLOOD CULTURES WHEN MULTIPLE SETS  ARE DRAWN IS UNCERTAIN. PLEASE NOTIFY THE MICROBIOLOGY DEPARTMENT WITHIN ONE WEEK IF SPECIATION AND SENSITIVITIES ARE REQUIRED. Performed at [facility]    Report Status 08/13/2015 FINAL  Final  Blood Culture ID Panel (Reflexed)     Status: Abnormal   Collection Time: 08/10/15 10:25 PM  Result Value Ref Range Status   Enterococcus species NOT DETECTED NOT DETECTED Final   Vancomycin resistance NOT DETECTED NOT DETECTED Final   Listeria monocytogenes NOT DETECTED NOT DETECTED Final   Staphylococcus species DETECTED (A) NOT DETECTED Final    Comment: CRITICAL RESULT CALLED TO, READ BACK BY AND VERIFIED WITH: MARY BETH HAWKINS RN 08/11/2015 1830 BEAMJ    Staphylococcus aureus NOT DETECTED NOT DETECTED Final   Methicillin resistance DETECTED (A) NOT DETECTED Final    Comment: CRITICAL RESULT CALLED TO, READ BACK BY AND VERIFIED WITH: MARY BETH HAWKINS RN 08/11/2015 1830 BEAMJ     Streptococcus species NOT DETECTED NOT DETECTED Final   Streptococcus agalactiae NOT DETECTED NOT DETECTED Final   Streptococcus pneumoniae NOT DETECTED NOT DETECTED Final   Streptococcus pyogenes NOT DETECTED NOT DETECTED Final   Acinetobacter baumannii NOT DETECTED NOT DETECTED Final   Enterobacteriaceae species NOT DETECTED NOT DETECTED Final   Enterobacter cloacae complex NOT DETECTED NOT DETECTED Final   Escherichia coli NOT DETECTED NOT DETECTED Final   Klebsiella oxytoca NOT DETECTED NOT DETECTED Final   Klebsiella pneumoniae NOT DETECTED NOT DETECTED Final   Proteus species NOT DETECTED NOT DETECTED Final   Serratia marcescens NOT DETECTED NOT DETECTED Final   Carbapenem resistance NOT DETECTED NOT DETECTED Final   Haemophilus influenzae NOT DETECTED NOT DETECTED Final   Neisseria meningitidis NOT DETECTED NOT DETECTED Final   Pseudomonas aeruginosa NOT DETECTED NOT DETECTED Final   Candida albicans NOT DETECTED NOT DETECTED Final   Candida glabrata NOT DETECTED NOT DETECTED Final   Candida krusei NOT DETECTED NOT DETECTED Final   Candida parapsilosis NOT DETECTED NOT DETECTED Final   Candida tropicalis NOT DETECTED NOT DETECTED Final    Comment: Performed at [facility]  Culture, blood (routine x 2)     Status: None (Preliminary result)   Collection Time: 08/11/15  7:45 PM  Result Value Ref Range Status   Specimen Description RIGHT ANTECUBITAL  Final   Special Requests BOTTLES DRAWN AEROBIC AND ANAEROBIC 6CC  Final   Culture NO GROWTH 3 DAYS  Final   Report Status PENDING  Incomplete  Culture, blood (routine x 2)     Status: None (Preliminary result)   Collection Time: 08/11/15  8:00 PM  Result Value Ref Range Status   Specimen Description BLOOD RIGHT HAND  Final   Special Requests BOTTLES DRAWN AEROBIC AND ANAEROBIC 6CC  Final   Culture NO GROWTH 3 DAYS  Final   Report Status PENDING  Incomplete     Labs: CBC:  Recent Labs Lab 08/10/15 2120 08/11/15 0539  08/12/15 0552  WBC 11.5* 6.3 4.6  NEUTROABS 8.8*  --  2.5  HGB 13.9 12.0* 11.3*  HCT 39.2 33.9* 32.8*  MCV 100.5* 98.8 100.3*  PLT 166 149* 163   Basic Metabolic Panel:  Recent Labs Lab 08/10/15 2120 08/11/15 0539 08/12/15 0552 08/12/15 0651  NA 133* 133* 140  --   K 3.5 3.1* 3.6  --   CL 97* 102 112*  --   CO2 19* 23 23  --   GLUCOSE 77 104* 113*  --   BUN 15 12 8   --   CREATININE  0.90 0.60* 0.49*  --   CALCIUM 8.6* 7.9* 8.0*  --   MG  --   --  1.6*  --   PHOS  --   --   --  3.2   Liver Function Tests:  Recent Labs Lab 08/10/15 2120 08/12/15 0552  AST 54* 24  ALT 29 17  ALKPHOS 143* 89  BILITOT 2.8* 0.7  PROT 8.1 5.7*  ALBUMIN 3.9 2.7*   No results for input(s): LIPASE, AMYLASE in the last 168 hours. No results for input(s): AMMONIA in the last 168 hours. Cardiac Enzymes:  Recent Labs Lab 08/12/15 0651  CKTOTAL 183   BNP (last 3 results) No results for input(s): BNP in the last 8760 hours. CBG: No results for input(s): GLUCAP in the last 168 hours. Time spent: 30 minutes  Signed:  Lynden OxfordEL, Dovid Bartko  [facility] 08/12/2015 , 6:07 PM

## 2015-08-15 LAB — CULTURE, BLOOD (ROUTINE X 2): Culture: NO GROWTH

## 2015-08-16 LAB — CULTURE, BLOOD (ROUTINE X 2)
Culture: NO GROWTH
Culture: NO GROWTH

## 2016-05-06 ENCOUNTER — Encounter (HOSPITAL_COMMUNITY): Payer: Self-pay | Admitting: *Deleted

## 2016-05-06 ENCOUNTER — Emergency Department (HOSPITAL_COMMUNITY)
Admission: EM | Admit: 2016-05-06 | Discharge: 2016-05-06 | Disposition: A | Discharge status: Home / Self Care | Payer: Self-pay | Attending: Dermatology | Admitting: Dermatology

## 2016-05-06 DIAGNOSIS — F17210 Nicotine dependence, cigarettes, uncomplicated: Secondary | ICD-10-CM | POA: Insufficient documentation

## 2016-05-06 DIAGNOSIS — Z5321 Procedure and treatment not carried out due to patient leaving prior to being seen by health care provider: Secondary | ICD-10-CM | POA: Insufficient documentation

## 2016-05-06 DIAGNOSIS — H938X2 Other specified disorders of left ear: Secondary | ICD-10-CM | POA: Insufficient documentation

## 2016-05-06 NOTE — ED Triage Notes (Signed)
Pt arrived by EMS saying unable to hear out of the left ear for the past 3 days.

## 2016-05-06 NOTE — ED Notes (Signed)
Pt walked out per secretary after being here 31 minutes and had not been seen.

## 2016-05-06 NOTE — ED Notes (Signed)
This RN saw this pt walking up the hallway towards the door. Unable to get pt to sign out AMA because this RN wasn't even sure who he was until the secretary Edson Snowballngelina told Claris [place], [place] that this pt walked out of his room and left.

## 2016-11-06 ENCOUNTER — Encounter (HOSPITAL_COMMUNITY): Payer: Self-pay | Admitting: *Deleted

## 2016-11-06 ENCOUNTER — Observation Stay (HOSPITAL_COMMUNITY)
Admission: EM | Admit: 2016-11-06 | Discharge: 2016-11-09 | Disposition: A | Discharge status: Home / Self Care | Attending: Internal Medicine | Admitting: Internal Medicine

## 2016-11-06 ENCOUNTER — Emergency Department (HOSPITAL_COMMUNITY)

## 2016-11-06 DIAGNOSIS — R509 Fever, unspecified: Secondary | ICD-10-CM | POA: Diagnosis not present

## 2016-11-06 DIAGNOSIS — F17210 Nicotine dependence, cigarettes, uncomplicated: Secondary | ICD-10-CM | POA: Diagnosis not present

## 2016-11-06 DIAGNOSIS — E876 Hypokalemia: Secondary | ICD-10-CM | POA: Diagnosis not present

## 2016-11-06 DIAGNOSIS — J181 Lobar pneumonia, unspecified organism: Secondary | ICD-10-CM | POA: Diagnosis not present

## 2016-11-06 DIAGNOSIS — J189 Pneumonia, unspecified organism: Secondary | ICD-10-CM | POA: Diagnosis present

## 2016-11-06 DIAGNOSIS — G894 Chronic pain syndrome: Secondary | ICD-10-CM | POA: Diagnosis not present

## 2016-11-06 DIAGNOSIS — F10921 Alcohol use, unspecified with intoxication delirium: Secondary | ICD-10-CM | POA: Diagnosis present

## 2016-11-06 DIAGNOSIS — D531 Other megaloblastic anemias, not elsewhere classified: Secondary | ICD-10-CM | POA: Insufficient documentation

## 2016-11-06 DIAGNOSIS — R4182 Altered mental status, unspecified: Secondary | ICD-10-CM

## 2016-11-06 DIAGNOSIS — R410 Disorientation, unspecified: Secondary | ICD-10-CM | POA: Diagnosis not present

## 2016-11-06 DIAGNOSIS — F10931 Alcohol use, unspecified with withdrawal delirium: Secondary | ICD-10-CM

## 2016-11-06 DIAGNOSIS — Z590 Homelessness: Secondary | ICD-10-CM | POA: Diagnosis not present

## 2016-11-06 DIAGNOSIS — D696 Thrombocytopenia, unspecified: Secondary | ICD-10-CM | POA: Diagnosis not present

## 2016-11-06 DIAGNOSIS — F1010 Alcohol abuse, uncomplicated: Secondary | ICD-10-CM

## 2016-11-06 DIAGNOSIS — F10231 Alcohol dependence with withdrawal delirium: Secondary | ICD-10-CM | POA: Diagnosis not present

## 2016-11-06 LAB — COMPREHENSIVE METABOLIC PANEL
ALBUMIN: 4 g/dL (ref 3.5–5.0)
ALK PHOS: 64 U/L (ref 38–126)
ALT: 31 U/L (ref 17–63)
AST: 45 U/L — AB (ref 15–41)
Anion gap: 11 (ref 5–15)
BUN: 16 mg/dL (ref 6–20)
CALCIUM: 9 mg/dL (ref 8.9–10.3)
CO2: 22 mmol/L (ref 22–32)
CREATININE: 0.81 mg/dL (ref 0.61–1.24)
Chloride: 107 mmol/L (ref 101–111)
GFR calc Af Amer: >60 mL/min (ref >60)
GFR calc non Af Amer: >60 mL/min (ref >60)
GLUCOSE: 101 mg/dL — AB (ref 65–99)
Potassium: 3.3 mmol/L — ABNORMAL LOW (ref 3.5–5.1)
SODIUM: 140 mmol/L (ref 135–145)
Total Bilirubin: 1.3 mg/dL — ABNORMAL HIGH (ref 0.3–1.2)
Total Protein: 7.3 g/dL (ref 6.5–8.1)

## 2016-11-06 LAB — CBC WITH DIFFERENTIAL/PLATELET
BASOS ABS: 0 10*3/uL (ref 0.0–0.1)
BASOS PCT: 0 %
EOS ABS: 0.1 10*3/uL (ref 0.0–0.7)
Eosinophils Relative: 1 %
HCT: 35.9 % — ABNORMAL LOW (ref 39.0–52.0)
HEMOGLOBIN: 12.5 g/dL — AB (ref 13.0–17.0)
LYMPHS ABS: 1.2 10*3/uL (ref 0.7–4.0)
Lymphocytes Relative: 9 %
MCH: 35.5 pg — ABNORMAL HIGH (ref 26.0–34.0)
MCHC: 34.8 g/dL (ref 30.0–36.0)
MCV: 102 fL — ABNORMAL HIGH (ref 78.0–100.0)
Monocytes Absolute: 1.6 10*3/uL — ABNORMAL HIGH (ref 0.1–1.0)
Monocytes Relative: 11 %
Neutro Abs: 11 10*3/uL — ABNORMAL HIGH (ref 1.7–7.7)
Neutrophils Relative %: 79 %
Platelets: 92 10*3/uL — ABNORMAL LOW (ref 150–400)
RBC: 3.52 MIL/uL — AB (ref 4.22–5.81)
RDW: 13.5 % (ref 11.5–15.5)
WBC: 13.9 10*3/uL — AB (ref 4.0–10.5)

## 2016-11-06 LAB — URINALYSIS, ROUTINE W REFLEX MICROSCOPIC
BILIRUBIN URINE: NEGATIVE
GLUCOSE, UA: NEGATIVE mg/dL
Hgb urine dipstick: NEGATIVE
KETONES UR: 20 mg/dL — AB
Leukocytes, UA: NEGATIVE
NITRITE: NEGATIVE
PH: 6 (ref 5.0–8.0)
PROTEIN: NEGATIVE mg/dL
Specific Gravity, Urine: 1.02 (ref 1.005–1.030)

## 2016-11-06 LAB — ETHANOL

## 2016-11-06 LAB — MAGNESIUM: Magnesium: 1.7 mg/dL (ref 1.7–2.4)

## 2016-11-06 LAB — I-STAT CG4 LACTIC ACID, ED
Lactic Acid, Venous: 1.23 mmol/L (ref 0.5–1.9)
Lactic Acid, Venous: 0.63 mmol/L (ref 0.5–1.9)

## 2016-11-06 LAB — RAPID URINE DRUG SCREEN, HOSP PERFORMED
AMPHETAMINES: NOT DETECTED
BARBITURATES: NOT DETECTED
Benzodiazepines: POSITIVE — AB
Cocaine: NOT DETECTED
Opiates: NOT DETECTED
Tetrahydrocannabinol: POSITIVE — AB

## 2016-11-06 LAB — ACETAMINOPHEN LEVEL: Acetaminophen (Tylenol), Serum: <10 ug/mL — ABNORMAL LOW (ref 10–30)

## 2016-11-06 LAB — I-STAT TROPONIN, ED: TROPONIN I, POC: 0 ng/mL (ref 0.00–0.08)

## 2016-11-06 LAB — SALICYLATE LEVEL

## 2016-11-06 MED ORDER — SODIUM CHLORIDE 0.9 % IV BOLUS (SEPSIS): 1000.0000 mL | Freq: Once | INTRAVENOUS | Status: DC

## 2016-11-06 MED ORDER — SODIUM CHLORIDE 0.9 % IV BOLUS (SEPSIS)
1000.0000 mL | Freq: Once | INTRAVENOUS | Status: AC
  Administered 2016-11-06: 1000 mL via INTRAVENOUS

## 2016-11-06 MED ORDER — THIAMINE HCL 100 MG/ML IJ SOLN
100.0000 mg | Freq: Every day | INTRAMUSCULAR | Status: DC
  Administered 2016-11-06: 100 mg via INTRAVENOUS
  Filled 2016-11-06: qty 2

## 2016-11-06 MED ORDER — LORAZEPAM 1 MG PO TABS: 0.0000 mg | ORAL_TABLET | Freq: Two times a day (BID) | ORAL | Status: DC

## 2016-11-06 MED ORDER — LORAZEPAM 2 MG/ML IJ SOLN
0.0000 mg | Freq: Four times a day (QID) | INTRAMUSCULAR | Status: DC
Start: 2016-11-06 — End: 2016-11-06
  Administered 2016-11-06: 1 mg via INTRAVENOUS
  Filled 2016-11-06: qty 1

## 2016-11-06 MED ORDER — LORAZEPAM 2 MG/ML IJ SOLN
1.0000 mg | Freq: Once | INTRAMUSCULAR | Status: AC
  Administered 2016-11-06: 1 mg via INTRAVENOUS
  Filled 2016-11-06: qty 1

## 2016-11-06 MED ORDER — VITAMIN B-1 100 MG PO TABS: 100.0000 mg | ORAL_TABLET | Freq: Every day | ORAL | Status: DC

## 2016-11-06 MED ORDER — LORAZEPAM 1 MG PO TABS: 0.0000 mg | ORAL_TABLET | Freq: Four times a day (QID) | ORAL | Status: DC

## 2016-11-06 MED ORDER — LORAZEPAM 2 MG/ML IJ SOLN: 0.0000 mg | Freq: Two times a day (BID) | INTRAMUSCULAR | Status: DC

## 2016-11-06 NOTE — ED Notes (Signed)
To Ct with [facility] present.

## 2016-11-06 NOTE — ED Triage Notes (Signed)
Pt brought in by [facility], pt is from the jail and has been there since 11-02-16. Pt is a heavy drinker and the nurse states in her paperwork that the pt is going through withdrawals and has been confused. [facility] states that the symptoms started today. Pt has been on Librium 50mg  2 x a day since 11-02-16. Pt is very HOH.

## 2016-11-06 NOTE — H&P (Signed)
[facility] History and Physical  JACQUELYN VILLELLA JOI:[id number] DOB: [birth year] DOA: 11/06/2016  Referring physician: Dr Verdie Mosher PCP: Patient, [name]   Chief Complaint: AMS, etoh withdrawal  HPI: Matthew Brady is a 56 y.o. male with history of etoh abuse , chronic pain, homelessness sent to ED from jail where he has been for the last 4 days since 11/02/16.  PT is a heavy alcohol drinker per jail staff and has been going through withdrawal and has been very confused.  At the jail he has been getting po Librium 50 mg bid for this.  Also pt is HOH.    In ED pt was evaluated. He initially was mostly Ox 3 w/o gross deficits per ED MD.  Head CT, CXR and UA were wnl.  He was given IV ativan per CIWA protocol.  Before that was "fidgety" and restless but now is sleeping soundly.  He has been coughing per the jail officers.      Past Medical History  Past Medical History:  Diagnosis Date  . Alcohol abuse   . Chronic back pain   . Chronic leg pain   . Homelessness   . Poor circulation of extremity    Past Surgical History  Past Surgical History:  Procedure Laterality Date  . BACK SURGERY    . left ear Left 1998   operated on left ear, due to "a hole in ear"   Family History History reviewed. No pertinent family history. Social History  reports that he has been smoking Cigarettes.  He has a 48.00 pack-year smoking history. He has never used smokeless tobacco. He reports that he drinks about 21.6 oz of alcohol per week . He reports that he does not use drugs. Allergies No Known Allergies Home medications Prior to Admission medications   Not on File   Liver Function Tests  Recent Labs Lab 11/06/16 1933  AST 45*  ALT 31  ALKPHOS 64  BILITOT 1.3*  PROT 7.3  ALBUMIN 4.0   No results for input(s): LIPASE, AMYLASE in the last 168 hours. CBC  Recent Labs Lab 11/06/16 1933  WBC 13.9*  NEUTROABS 11.0*  HGB 12.5*  HCT 35.9*  MCV 102.0*  PLT 92*   Basic Metabolic  Panel  Recent Labs Lab 11/06/16 1933  NA 140  K 3.3*  CL 107  CO2 22  GLUCOSE 101*  BUN 16  CREATININE 0.81  CALCIUM 9.0     Vitals:   11/06/16 1954 11/06/16 2000 11/06/16 2030 11/06/16 2100  BP: 117/77  140/90 137/84  Pulse: (!) 102 (!) 107 (!) 101   Resp:  (!) 29 (!) 28 (!) 23  Temp:      TempSrc:      SpO2:  100% 98%   Weight:      Height:       Exam: Gen sleeping soundly, not able to arouse , after IV ATivan No rash, cyanosis or gangrene Sclera anicteric, throat not examined  No jvd or bruits Chest clear bilat RRR no MRG Abd soft ntnd no mass or ascites +bs GU normal male MS no joint effusions or deformity Ext no LE or UE edema / no wounds or ulcers Neuro is sedated after IV ativan, was moving all ext prior to Ativan per officers   EKG (independ reviewed) > sinus tach, no ischemic changes CXR (independ reviewed) > basilar atelectasis UA negative WBC 13k  Hb 12.5  Tylenol < 10, salicylate < 7    Assessment:  1.  Etoh withdrawal - with mild delirium , tremulousness.  Admit, IVF"s, CIWA protocol with IV Ativan. 2.  Fevers - temp 101.7 in ED, atelectasis L base may be PNA.  Will rx for PNA.   3.  Chronic pain  4.  Homelessness by history - now in jail  Plan - as above      Bryon Parker D [facility] Pager [phone]   If 7PM-7AM, please contact night-coverage [email or web address] Password [facility] 11/06/2016, 10:36 PM

## 2016-11-06 NOTE — ED Provider Notes (Signed)
[facility] DEPT Provider Note   CSN: [id number] Arrival date & time: 11/06/16  1854     History   Chief Complaint Chief Complaint  Patient presents with  . Altered Mental Status    HPI Matthew Brady is a 56 y.o. male.  HPI  Level 5 caveat due to confusion. History of alcohol abuse and chronic leg pain/back pain. Brought in by [facility]. He is brought from jail, where he has been since 8/17. Receiving librium 50 mg BID, but nurse there concern for withdrawal symptoms. Has been noted to be altered and shaking. Symptoms noted to have started today. Patient complains of pain in legs, back and chest. Endorses coughing, dyspnea, n/v/d, and feeling unwel. Oriented to time, place, and self only.   Past Medical History:  Diagnosis Date  . Alcohol abuse   . Chronic back pain   . Chronic leg pain   . Homelessness   . Poor circulation of extremity     Patient Active Problem List   Diagnosis Date Noted  . Closed rib fracture 08/10/2015  . CAP (community acquired pneumonia) 08/10/2015  . Weakness generalized 08/10/2015  . Back pain 08/10/2015    Past Surgical History:  Procedure Laterality Date  . BACK SURGERY    . left ear Left 1998   operated on left ear, due to "a hole in ear"       Home Medications    Prior to Admission medications   Not on File    Family History History reviewed. No pertinent family history.  Social History Social History  Substance Use Topics  . Smoking status: Current Every Day Smoker    Packs/day: 1.50    Years: 32.00    Types: Cigarettes  . Smokeless tobacco: Never Used  . Alcohol use 21.6 oz/week    36 Cans of beer per week     Comment: quit over a month ago     Allergies   Patient has no known allergies.   Review of Systems Review of Systems  Unable to perform ROS: Mental status change     Physical Exam Updated Vital Signs BP 137/84   Pulse (!) 101   Temp (!) 101.7 F (38.7 C) (Rectal)   Resp (!) 23   Ht 5\' 9"   (1.753 m)   Wt 78.9 kg (174 lb)   SpO2 98%   BMI 25.70 kg/m   Physical Exam Physical Exam  Nursing note and vitals reviewed. Constitutional: appears altered, confused, non-toxic and in no acute distress Head: Normocephalic and atraumatic.  Mouth/Throat: Oropharynx is clear and moist.  Neck: Normal range of motion. Neck supple. no nuchal rigidity Cardiovascular: tachycardic rate and regular rhythm.   Pulmonary/Chest: Effort normal and breath sounds normal.  Abdominal: Soft. There is no tenderness. There is no rebound and no guarding.  Musculoskeletal: No deformity. Occasional jerking of extremities Neurological: Alert, no facial droop, fluent speech, moves all extremities symmetrically Skin: Skin is warm and dry.  Psychiatric: Cooperative   ED Treatments / Results  Labs (all labs ordered are listed, but only abnormal results are displayed) Labs Reviewed  CBC WITH DIFFERENTIAL/PLATELET - Abnormal; Notable for the following:       Result Value   WBC 13.9 (*)    RBC 3.52 (*)    Hemoglobin 12.5 (*)    HCT 35.9 (*)    MCV 102.0 (*)    MCH 35.5 (*)    Platelets 92 (*)    Neutro Abs 11.0 (*)  Monocytes Absolute 1.6 (*)    All other components within normal limits  COMPREHENSIVE METABOLIC PANEL - Abnormal; Notable for the following:    Potassium 3.3 (*)    Glucose, Bld 101 (*)    AST 45 (*)    Total Bilirubin 1.3 (*)    All other components within normal limits  RAPID URINE DRUG SCREEN, HOSP PERFORMED - Abnormal; Notable for the following:    Benzodiazepines POSITIVE (*)    Tetrahydrocannabinol POSITIVE (*)    All other components within normal limits  ACETAMINOPHEN LEVEL - Abnormal; Notable for the following:    Acetaminophen (Tylenol), Serum <10 (*)    All other components within normal limits  URINALYSIS, ROUTINE W REFLEX MICROSCOPIC - Abnormal; Notable for the following:    Ketones, ur 20 (*)    All other components within normal limits  CULTURE, BLOOD (ROUTINE X 2)   CULTURE, BLOOD (ROUTINE X 2)  URINE CULTURE  MAGNESIUM  ETHANOL  SALICYLATE LEVEL  I-STAT CG4 LACTIC ACID, ED  I-STAT TROPONIN, ED  I-STAT CG4 LACTIC ACID, ED    EKG  EKG Interpretation  Date/Time:  Tuesday November 06 2016 19:47:27 EDT Ventricular Rate:  104 PR Interval:    QRS Duration: 99 QT Interval:  343 QTC Calculation: 452 R Axis:   83 Text Interpretation:  Sinus tachycardia no acute changes  Confirmed by Crista Curb [phone]) on 11/06/2016 8:16:33 PM       Radiology Dg Chest 1 View  Result Date: 11/06/2016 CLINICAL DATA:  Altered mental status, current smoker Create EXAM: CHEST 1 VIEW COMPARISON:  08/10/2015 FINDINGS: The heart size is within normal limits. There is aortic atherosclerosis at the arch without aneurysm. There is bibasilar atelectasis with without pneumonic consolidation. No overt pulmonary edema, effusion or pneumothorax. No acute osseous abnormality. IMPRESSION: Aortic atherosclerosis.  Bibasilar atelectasis. Electronically Signed   By: Tollie Eth M.D.   On: 11/06/2016 20:05   Ct Head Wo Contrast  Result Date: 11/06/2016 CLINICAL DATA:  Confusion.  Alcohol withdrawal. EXAM: CT HEAD WITHOUT CONTRAST TECHNIQUE: Contiguous axial images were obtained from the base of the skull through the vertex without intravenous contrast. COMPARISON:  08/10/2015 FINDINGS: Brain: No evidence of acute infarction, hemorrhage, hydrocephalus, extra-axial collection or mass lesion/mass effect. Age advanced brain parenchymal volume loss and mild periventricular microangiopathy. Vascular: Mild calcific atherosclerotic disease at the skullbase. Skull: Normal. Negative for fracture or focal lesion. Sinuses/Orbits: No acute finding. Other: None. IMPRESSION: No acute intracranial abnormality. Age advanced brain parenchymal volume loss and mild chronic microvascular disease. Electronically Signed   By: Ted Mcalpine M.D.   On: 11/06/2016 20:52    Procedures Procedures (including  critical care time)  Medications Ordered in ED Medications  LORazepam (ATIVAN) injection 0-4 mg (1 mg Intravenous Given 11/06/16 2003)    Or  LORazepam (ATIVAN) tablet 0-4 mg ( Oral See Alternative 11/06/16 2003)  LORazepam (ATIVAN) injection 0-4 mg (not administered)    Or  LORazepam (ATIVAN) tablet 0-4 mg (not administered)  thiamine (VITAMIN B-1) tablet 100 mg ( Oral See Alternative 11/06/16 1944)    Or  thiamine (B-1) injection 100 mg (100 mg Intravenous Given 11/06/16 1944)  sodium chloride 0.9 % bolus 1,000 mL (1,000 mLs Intravenous New Bag/Given 11/06/16 1944)  LORazepam (ATIVAN) injection 1 mg (1 mg Intravenous Given 11/06/16 1944)     Initial Impression / Assessment and Plan / ED Course  I have reviewed the triage vital signs and the nursing notes.  Pertinent labs & imaging  results that were available during my care of the patient were reviewed by me and considered in my medical decision making (see chart for details).     Presenting with concern for alcohol withdrawal with delirium. He is alert, oriented 3 but not just to relation. I grossly no neurological deficits. Does incidentally have fever of 101.7 mild tachycardia. Infectious workup was pursued. Normal lactate, mild leukocytosis of 13.9 which is not specific. His UA is unremarkable. Chest x-ray shows no evidence of pneumonia. CT head is without acute intracranial processes. He is no meningismus and at this time I'm not concerned about meningitis. Suspect that may be his fever and tachycardia is more related to his alcohol withdrawal and possible delirium tremens. He did receive 2 mg of IV Ativan with some improvement in his symptoms. Discussed with Dr. Arta Silence who will admit to hospitalist service.   Final Clinical Impressions(s) / ED Diagnoses   Final diagnoses:  Alcohol withdrawal syndrome, with delirium [facility])    New Prescriptions New Prescriptions   No medications on file     Lavera Guise, MD 11/06/16 2213

## 2016-11-07 DIAGNOSIS — F10921 Alcohol use, unspecified with intoxication delirium: Secondary | ICD-10-CM | POA: Diagnosis not present

## 2016-11-07 LAB — CBC
HCT: 33.7 % — ABNORMAL LOW (ref 39.0–52.0)
Hemoglobin: 11.6 g/dL — ABNORMAL LOW (ref 13.0–17.0)
MCH: 35.6 pg — AB (ref 26.0–34.0)
MCHC: 34.4 g/dL (ref 30.0–36.0)
MCV: 103.4 fL — AB (ref 78.0–100.0)
PLATELETS: 95 10*3/uL — AB (ref 150–400)
RBC: 3.26 MIL/uL — ABNORMAL LOW (ref 4.22–5.81)
RDW: 13.6 % (ref 11.5–15.5)
WBC: 10.4 10*3/uL (ref 4.0–10.5)

## 2016-11-07 LAB — MRSA PCR SCREENING: MRSA BY PCR: POSITIVE — AB

## 2016-11-07 MED ORDER — DEXTROSE 5 % IV SOLN
INTRAVENOUS | Status: AC
  Filled 2016-11-07: qty 10

## 2016-11-07 MED ORDER — MUPIROCIN 2 % EX OINT
1.0000 "application " | TOPICAL_OINTMENT | Freq: Two times a day (BID) | CUTANEOUS | Status: DC
  Administered 2016-11-07 – 2016-11-09 (×5): 1 via NASAL
  Filled 2016-11-07: qty 22

## 2016-11-07 MED ORDER — THIAMINE HCL 100 MG/ML IJ SOLN: 100.0000 mg | Freq: Every day | INTRAMUSCULAR | Status: DC

## 2016-11-07 MED ORDER — ONDANSETRON HCL 4 MG PO TABS: 4.0000 mg | ORAL_TABLET | Freq: Four times a day (QID) | ORAL | Status: DC | PRN

## 2016-11-07 MED ORDER — ACETAMINOPHEN 325 MG RE SUPP: 325.0000 mg | Freq: Four times a day (QID) | RECTAL | Status: DC | PRN

## 2016-11-07 MED ORDER — FOLIC ACID 1 MG PO TABS
1.0000 mg | ORAL_TABLET | Freq: Every day | ORAL | Status: DC
  Administered 2016-11-07 – 2016-11-09 (×3): 1 mg via ORAL
  Filled 2016-11-07 (×3): qty 1

## 2016-11-07 MED ORDER — THIAMINE HCL 100 MG/ML IJ SOLN
INTRAMUSCULAR | Status: AC
  Filled 2016-11-07: qty 2

## 2016-11-07 MED ORDER — LORAZEPAM 2 MG/ML IJ SOLN
1.0000 mg | Freq: Four times a day (QID) | INTRAMUSCULAR | Status: DC | PRN
  Administered 2016-11-09: 1 mg via INTRAVENOUS
  Filled 2016-11-07: qty 1

## 2016-11-07 MED ORDER — ACETAMINOPHEN 500 MG PO TABS
500.0000 mg | ORAL_TABLET | Freq: Four times a day (QID) | ORAL | Status: DC | PRN
  Administered 2016-11-07 – 2016-11-08 (×2): 500 mg via ORAL
  Filled 2016-11-07 (×2): qty 1

## 2016-11-07 MED ORDER — FOLIC ACID 5 MG/ML IJ SOLN
INTRAMUSCULAR | Status: AC
  Filled 2016-11-07: qty 0.2

## 2016-11-07 MED ORDER — DEXTROSE 5 % IV SOLN
INTRAVENOUS | Status: AC
  Filled 2016-11-07: qty 500

## 2016-11-07 MED ORDER — LORAZEPAM 1 MG PO TABS: 1.0000 mg | ORAL_TABLET | Freq: Four times a day (QID) | ORAL | Status: DC | PRN

## 2016-11-07 MED ORDER — POTASSIUM CHLORIDE CRYS ER 20 MEQ PO TBCR
40.0000 meq | EXTENDED_RELEASE_TABLET | Freq: Two times a day (BID) | ORAL | Status: AC
  Administered 2016-11-07 (×2): 40 meq via ORAL
  Filled 2016-11-07 (×2): qty 2

## 2016-11-07 MED ORDER — M.V.I. ADULT IV INJ
INJECTION | INTRAVENOUS | Status: AC
  Filled 2016-11-07: qty 10

## 2016-11-07 MED ORDER — LORAZEPAM 2 MG/ML IJ SOLN
0.0000 mg | Freq: Four times a day (QID) | INTRAMUSCULAR | Status: DC
  Administered 2016-11-07 – 2016-11-08 (×3): 1 mg via INTRAVENOUS
  Administered 2016-11-08: 2 mg via INTRAVENOUS
  Administered 2016-11-08 (×2): 1 mg via INTRAVENOUS
  Filled 2016-11-07 (×6): qty 1

## 2016-11-07 MED ORDER — ACETAMINOPHEN 325 MG PO TABS: 650.0000 mg | ORAL_TABLET | Freq: Four times a day (QID) | ORAL | Status: DC | PRN

## 2016-11-07 MED ORDER — POLYETHYLENE GLYCOL 3350 17 G PO PACK: 17.0000 g | PACK | Freq: Every day | ORAL | Status: DC | PRN

## 2016-11-07 MED ORDER — ONDANSETRON HCL 4 MG/2ML IJ SOLN
4.0000 mg | Freq: Four times a day (QID) | INTRAMUSCULAR | Status: DC | PRN
  Administered 2016-11-07: 4 mg via INTRAVENOUS
  Filled 2016-11-07: qty 2

## 2016-11-07 MED ORDER — CHLORHEXIDINE GLUCONATE CLOTH 2 % EX PADS
6.0000 | MEDICATED_PAD | Freq: Every day | CUTANEOUS | Status: DC
  Administered 2016-11-07 – 2016-11-09 (×3): 6 via TOPICAL

## 2016-11-07 MED ORDER — VITAMIN B-1 100 MG PO TABS
100.0000 mg | ORAL_TABLET | Freq: Every day | ORAL | Status: DC
  Administered 2016-11-07 – 2016-11-09 (×3): 100 mg via ORAL
  Filled 2016-11-07 (×3): qty 1

## 2016-11-07 MED ORDER — THIAMINE HCL 100 MG/ML IJ SOLN
Freq: Once | INTRAVENOUS | Status: AC
  Administered 2016-11-07: 02:00:00 via INTRAVENOUS
  Filled 2016-11-07: qty 1000

## 2016-11-07 MED ORDER — DEXTROSE 5 % IV SOLN
500.0000 mg | INTRAVENOUS | Status: DC
  Administered 2016-11-07: 500 mg via INTRAVENOUS
  Filled 2016-11-07 (×2): qty 500

## 2016-11-07 MED ORDER — ADULT MULTIVITAMIN W/MINERALS CH
1.0000 | ORAL_TABLET | Freq: Every day | ORAL | Status: DC
  Administered 2016-11-07 – 2016-11-09 (×3): 1 via ORAL
  Filled 2016-11-07 (×3): qty 1

## 2016-11-07 MED ORDER — ACETAMINOPHEN 650 MG RE SUPP: 650.0000 mg | Freq: Four times a day (QID) | RECTAL | Status: DC | PRN

## 2016-11-07 MED ORDER — LORAZEPAM 2 MG/ML IJ SOLN
0.0000 mg | Freq: Two times a day (BID) | INTRAMUSCULAR | Status: DC
  Administered 2016-11-09: 1 mg via INTRAVENOUS
  Filled 2016-11-07: qty 1

## 2016-11-07 MED ORDER — ENOXAPARIN SODIUM 40 MG/0.4ML ~~LOC~~ SOLN
40.0000 mg | SUBCUTANEOUS | Status: DC
  Administered 2016-11-07: 40 mg via SUBCUTANEOUS
  Filled 2016-11-07: qty 0.4

## 2016-11-07 MED ORDER — DEXTROSE 5 % IV SOLN
1.0000 g | INTRAVENOUS | Status: DC
  Administered 2016-11-07: 1 g via INTRAVENOUS
  Filled 2016-11-07 (×2): qty 10

## 2016-11-07 NOTE — Progress Notes (Signed)
[name]  SEG:[id number] DOB: 1960/09/20 DOA: 11/06/2016 PCP: Patient, [name]    Brief Narrative:  56 y.o. male with history of heavy etoh abuse, chronic pain, and homelessness who was sent to the ED 8/21 from jail where he had been since 11/02/16 when he began to display signs of withdrawal and severe confusion despite getting po Librium 50 mg bid.  In the ED Head CT, CXR and UA were wnl.  He was given IV ativan per CIWA protocol which controlled his sx.  Subjective: The patient is somewhat sluggish but able to answer my questions and wake up during my exam.  He states he feels weak all over and complains of diffuse pain.  He is able to tell me the hospital that he is in but does not know the date or the city.  He denies shortness of breath fevers or chills.  Assessment & Plan:  Acute alcohol withdrawal Continue Ativan per CIWA protocol - appears to be slowly improving - anticipate 24-48 hours of further treatment - counseled patient on absolute need to discontinue alcohol use  Thrombocytopenia most likely due to alcohol abuse  Hypokalemia Replace and follow - check magnesium  Macrocytic anemia Most likely due to alcohol abuse - check B-12 and folic acid levels  Chronic pain Does not appear to be severe at the present time - follow clinically  Homeless Social work consultation  MRSA screen +  DVT prophylaxis: SCDs Code Status: FULL CODE Family Communication: no family present at time of exam  Disposition Plan:   Consultants:  none  Procedures: none  Antimicrobials:  none   Objective: Brady pressure 110/60, pulse 98, temperature 98.3 F (36.8 C), temperature source Axillary, resp. rate 18, height 5\' 9"  (1.753 m), weight 76.2 kg (167 lb 15.9 oz), SpO2 99 %.  Intake/Output Summary (Last 24 hours) at 11/07/16 1153 Last data filed at 11/07/16 0700  Gross per 24 hour  Intake           1912.5 ml  Output              600 ml  Net            1312.5 ml   Filed Weights   11/06/16 1855 11/07/16 0040 11/07/16 0500  Weight: 78.9 kg (174 lb) 75.6 kg (166 lb 10.7 oz) 76.2 kg (167 lb 15.9 oz)    Examination: General: No acute respiratory distress - mildly confused  Lungs: Clear to auscultation bilaterally without wheezes or crackles Cardiovascular: Regular rate and rhythm without murmur gallop or rub normal S1 and S2 Abdomen: Nontender, nondistended, soft, bowel sounds positive, no rebound, no ascites, no appreciable mass Extremities: No significant cyanosis, clubbing, or edema bilateral lower extremities  CBC:  Recent Labs Lab 11/06/16 1933 11/07/16 0404  WBC 13.9* 10.4  NEUTROABS 11.0*  --   HGB 12.5* 11.6*  HCT 35.9* 33.7*  MCV 102.0* 103.4*  PLT 92* 95*   Basic Metabolic Panel:  Recent Labs Lab 11/06/16 1933  NA 140  K 3.3*  CL 107  CO2 22  GLUCOSE 101*  BUN 16  CREATININE 0.81  CALCIUM 9.0  MG 1.7   GFR: Estimated Creatinine Clearance: 104.3 mL/min (by C-G formula based on SCr of 0.81 mg/dL).  Liver Function Tests:  Recent Labs Lab 11/06/16 1933  AST 45*  ALT 31  ALKPHOS 64  BILITOT 1.3*  PROT 7.3  ALBUMIN 4.0   No results for input(s): LIPASE, AMYLASE  in the last 168 hours. No results for input(s): AMMONIA in the last 168 hours.   Recent Results (from the past 240 hour(s))  Brady culture (routine x 2)     Status: None (Preliminary result)   Collection Time: 11/06/16  7:33 PM  Result Value Ref Range Status   Specimen Description Brady LEFT HAND  Final   Special Requests   Final    BOTTLES DRAWN AEROBIC AND ANAEROBIC Brady Culture adequate volume   Culture NO GROWTH < 12 HOURS  Final   Report Status PENDING  Incomplete  Brady culture (routine x 2)     Status: None (Preliminary result)   Collection Time: 11/06/16  7:33 PM  Result Value Ref Range Status   Specimen Description LEFT ANTECUBITAL  Final   Special Requests   Final    BOTTLES DRAWN AEROBIC AND ANAEROBIC Brady Culture  adequate volume   Culture NO GROWTH < 12 HOURS  Final   Report Status PENDING  Incomplete  MRSA PCR Screening     Status: Abnormal   Collection Time: 11/07/16  1:56 AM  Result Value Ref Range Status   MRSA by PCR POSITIVE (A) NEGATIVE Final    Comment:        The GeneXpert MRSA Assay (FDA approved for NASAL specimens only), is one component of a comprehensive MRSA colonization surveillance program. It is not intended to diagnose MRSA infection nor to guide or monitor treatment for MRSA infections. RESULT CALLED TO, READ BACK BY AND VERIFIED WITH: Matthew Brady @ 0418 BY Matthew Brady 8.22.18      Scheduled Meds: . Chlorhexidine Gluconate Cloth  6 each Topical Q0600  . enoxaparin (LOVENOX) injection  40 mg Subcutaneous Q24H  . folic acid  1 mg Oral Daily  . LORazepam  0-4 mg Intravenous Q6H   Followed by  . [START ON 11/09/2016] LORazepam  0-4 mg Intravenous Q12H  . multivitamin with minerals  1 tablet Oral Daily  . mupirocin ointment  1 application Nasal BID  . thiamine  100 mg Oral Daily   Or  . thiamine  100 mg Intravenous Daily     LOS: 0 days   Matthew Blood, MD [facility] Office  [phone] Pager - Text Page per Loretha Stapler as per below:  On-Call/Text Page:      [email or web address]      password [email or web address]  If 7PM-7AM, please contact night-coverage [email or web address] Password [facility] 11/07/2016, 11:53 AM

## 2016-11-07 NOTE — Evaluation (Signed)
Physical Therapy Evaluation Patient Details Name: Matthew Brady MRN: [id number] DOB: [birth year] Today's Date: 11/07/2016   History of Present Illness  Matthew Brady is a 56 y.o. male with history of etoh abuse , chronic pain, homelessness sent to ED from jail where he has been for the last 4 days since 11/02/16.  PT is a heavy alcohol drinker per jail staff and has been going through withdrawal and has been very confused.  At the jail he has been getting po Librium 50 mg bid for this.  Also pt is HOH.    Clinical Impression  Matthew Brady states that he has had difficulty with his legs ever since his back surgery five years ago.  He uses a walker intermittently to ambulate with.  He states that he lives with his girlfriend but at this time he is incarcerated.  At this time he had decreased tolerance to ambulation and needs a rolling walker for stability.  Matthew Brady will benefit from skilled therapy at this time to increase his functional tolerance.     Follow Up Recommendations Supervision for mobility/OOB    Equipment Recommendations  Rolling walker with 5" wheels    Recommendations for Other Services       Precautions / Restrictions Precautions Precautions: Fall Restrictions Weight Bearing Restrictions: No      Mobility  Bed Mobility Overal bed mobility: Needs Assistance Bed Mobility: Supine to Sit;Sit to Supine     Supine to sit: Supervision Sit to supine: Supervision      Transfers Overall transfer level: Modified independent Equipment used: Rolling walker (2 wheeled)                Ambulation/Gait Ambulation/Gait assistance: Supervision Ambulation Distance (Feet): 20 Feet Assistive device: Rolling walker (2 wheeled) Gait Pattern/deviations: Decreased step length - right;Decreased step length - left;Decreased dorsiflexion - right;Decreased dorsiflexion - left   Gait velocity interpretation: <1.8 ft/sec, indicative of risk for recurrent falls     Stairs            Wheelchair Mobility    Modified Rankin (Stroke Patients Only)       Balance                                             Pertinent Vitals/Pain Pain Assessment: 0-10 Pain Score: 6  Pain Location: knees Pain Descriptors / Indicators: Aching Pain Intervention(s): Limited activity within patient's tolerance    Home Living Family/patient expects to be discharged to:: Unsure                      Prior Function Level of Independence: Independent with assistive device(s)         Comments: sometimes pt can walk I other times he needs a walker      Hand Dominance        Extremity/Trunk Assessment        Lower Extremity Assessment Lower Extremity Assessment: Generalized weakness       Communication   Communication: No difficulties  Cognition Arousal/Alertness: Awake/alert Behavior During Therapy: WFL for tasks assessed/performed Overall Cognitive Status: Within Functional Limits for tasks assessed  General Comments      Exercises General Exercises - Lower Extremity Ankle Circles/Pumps: Both;10 reps;AAROM Quad Sets: Both;5 reps Heel Slides: Both;AAROM;10 reps Hip ABduction/ADduction: Both;AAROM;10 reps   Assessment/Plan    PT Assessment Patient needs continued PT services  PT Problem List Decreased strength;Decreased activity tolerance;Decreased balance;Pain;Decreased mobility       PT Treatment Interventions Gait training;Functional mobility training;Therapeutic exercise;Balance training;Therapeutic activities    PT Goals (Current goals can be found in the Care Plan section)  Acute Rehab PT Goals PT Goal Formulation: With patient Time For Goal Achievement: 11/09/16 Potential to Achieve Goals: Good    Frequency Min 3X/week   Barriers to discharge Decreased caregiver support      Co-evaluation               AM-PAC PT "6 Clicks"  Daily Activity  Outcome Measure Difficulty turning over in bed (including adjusting bedclothes, sheets and blankets)?: A Little Difficulty moving from lying on back to sitting on the side of the bed? : A Little Difficulty sitting down on and standing up from a chair with arms (e.g., wheelchair, bedside commode, etc,.)?: A Little Help needed moving to and from a bed to chair (including a wheelchair)?: A Little Help needed walking in hospital room?: A Little Help needed climbing 3-5 steps with a railing? : A Lot 6 Click Score: 17    End of Session Equipment Utilized During Treatment: Gait belt Activity Tolerance: Patient limited by pain Patient left: in bed   PT Visit Diagnosis: Unsteadiness on feet (R26.81);Other abnormalities of gait and mobility (R26.89);Difficulty in walking, not elsewhere classified (R26.2)    Time: 3888-7579 PT Time Calculation (min) (ACUTE ONLY): 42 min   Charges:   PT Evaluation $PT Eval Low Complexity: 1 Low PT Treatments $Therapeutic Exercise: 8-22 mins   PT G Codes:   PT G-Codes **NOT FOR INPATIENT CLASS** Functional Assessment Tool Used: AM-PAC 6 Clicks Basic Mobility Functional Limitation: Mobility: Walking and moving around Mobility: Walking and Moving Around Current Status (J2820): At least 40 percent but less than 60 percent impaired, limited or restricted Mobility: Walking and Moving Around Goal Status [phone]): At least 40 percent but less than 60 percent impaired, limited or restricted Mobility: Walking and Moving Around Discharge Status [phone]): At least 40 percent but less than 60 percent impaired, limited or restricted   Virgina Organ, PT CLT [phone] 11/07/2016, 4:07 PM

## 2016-11-08 DIAGNOSIS — F10921 Alcohol use, unspecified with intoxication delirium: Secondary | ICD-10-CM | POA: Diagnosis not present

## 2016-11-08 LAB — COMPREHENSIVE METABOLIC PANEL
ALBUMIN: 3 g/dL — AB (ref 3.5–5.0)
ALT: 21 U/L (ref 17–63)
ANION GAP: 5 (ref 5–15)
AST: 30 U/L (ref 15–41)
Alkaline Phosphatase: 54 U/L (ref 38–126)
BILIRUBIN TOTAL: 0.3 mg/dL (ref 0.3–1.2)
BUN: 9 mg/dL (ref 6–20)
CHLORIDE: 107 mmol/L (ref 101–111)
CO2: 27 mmol/L (ref 22–32)
Calcium: 8.3 mg/dL — ABNORMAL LOW (ref 8.9–10.3)
Creatinine, Ser: 0.7 mg/dL (ref 0.61–1.24)
GFR calc Af Amer: >60 mL/min (ref >60)
GFR calc non Af Amer: >60 mL/min (ref >60)
GLUCOSE: 101 mg/dL — AB (ref 65–99)
POTASSIUM: 3.9 mmol/L (ref 3.5–5.1)
SODIUM: 139 mmol/L (ref 135–145)
TOTAL PROTEIN: 6.1 g/dL — AB (ref 6.5–8.1)

## 2016-11-08 LAB — URINE CULTURE: CULTURE: NO GROWTH

## 2016-11-08 LAB — CBC
HEMATOCRIT: 35.4 % — AB (ref 39.0–52.0)
HEMOGLOBIN: 12.1 g/dL — AB (ref 13.0–17.0)
MCH: 35.4 pg — ABNORMAL HIGH (ref 26.0–34.0)
MCHC: 34.2 g/dL (ref 30.0–36.0)
MCV: 103.5 fL — AB (ref 78.0–100.0)
Platelets: 128 10*3/uL — ABNORMAL LOW (ref 150–400)
RBC: 3.42 MIL/uL — ABNORMAL LOW (ref 4.22–5.81)
RDW: 13.4 % (ref 11.5–15.5)
WBC: 5.9 10*3/uL (ref 4.0–10.5)

## 2016-11-08 LAB — VITAMIN B12: Vitamin B-12: 362 pg/mL (ref 180–914)

## 2016-11-08 LAB — HIV ANTIBODY (ROUTINE TESTING W REFLEX): HIV SCREEN 4TH GENERATION: NONREACTIVE

## 2016-11-08 LAB — MAGNESIUM: MAGNESIUM: 1.9 mg/dL (ref 1.7–2.4)

## 2016-11-08 LAB — FOLATE: FOLATE: 11.1 ng/mL (ref >5.9)

## 2016-11-08 MED ORDER — CLONIDINE HCL 0.1 MG PO TABS
0.1000 mg | ORAL_TABLET | Freq: Three times a day (TID) | ORAL | Status: DC
  Administered 2016-11-08 – 2016-11-09 (×3): 0.1 mg via ORAL
  Filled 2016-11-08 (×3): qty 1

## 2016-11-08 NOTE — Progress Notes (Signed)
[place] TEAM 3  Matthew Brady  DZH:[id number] DOB: 1961-03-05 DOA: 11/06/2016 PCP: Patient, [name]    Brief Narrative:  56 y.o. male with history of heavy etoh abuse, chronic pain, and homelessness who was sent to the ED 8/21 from jail where he had been since 11/02/16 when he began to display signs of withdrawal and severe confusion despite getting po Librium 50 mg bid.  In the ED Head CT, CXR and UA were wnl.  He was given IV ativan per CIWA protocol which controlled his sx.  Subjective: The pt is more alert this morning, but remains quite confused.  He has required 3mg  of ativan thus far today.  He does appear to be slowly improving.  He is somewhat tremulous.  He can not tell me where he is or the year.  He c/o diffuse pain to include his chest, abdom, and epigastrium.  He tells me he feels very weak in general and "can't walk."  Assessment & Plan:  Acute alcohol withdrawal Continue Ativan per CIWA protocol - has required 3mg  thus far today - appears to be slowly improving - anticipate 24-48 hours of further treatment will be required - desire for pt to be completely off benzos/CIWA prior to d/c - counseled patient on absolute need to discontinue alcohol use again today   Thrombocytopenia most likely due to alcohol abuse - improving   Hypokalemia Replaced to normal range - Mg is normal   Macrocytic anemia Most likely due to alcohol abuse - B-12 and folic acid levels pending   Chronic pain Does not appear to be severe at the present time - follow clinically - pattern does not fit a particular unifying etiology   Homeless Social work consultation - unclear if pt will still be incarcerated at the time of his d/c presently   MRSA screen +  DVT prophylaxis: SCDs Code Status: FULL CODE Family Communication: no family present at time of exam  Disposition Plan: cont CIWA - wean off benzos entirely as able   Consultants:  none  Procedures: none  Antimicrobials:    none   Objective: Blood pressure (!) 154/80, pulse 63, temperature 98.4 F (36.9 C), temperature source Oral, resp. rate 14, height 5\' 9"  (1.753 m), weight 76.2 kg (167 lb 15.9 oz), SpO2 98 %.  Intake/Output Summary (Last 24 hours) at 11/08/16 1019 Last data filed at 11/08/16 0900  Gross per 24 hour  Intake              720 ml  Output             1200 ml  Net             -480 ml   Filed Weights   11/06/16 1855 11/07/16 0040 11/07/16 0500  Weight: 78.9 kg (174 lb) 75.6 kg (166 lb 10.7 oz) 76.2 kg (167 lb 15.9 oz)    Examination: General: No acute respiratory distress - more alert but remains confused  Lungs: CTA B w/o wheezing  Cardiovascular: RRR w/o gallup or rub  Abdomen: NT/ND, soft, bs+, no mass  Extremities: No significant edema bilateral lower extremities  CBC:  Recent Labs Lab 11/06/16 1933 11/07/16 0404 11/08/16 0533  WBC 13.9* 10.4 5.9  NEUTROABS 11.0*  --   --   HGB 12.5* 11.6* 12.1*  HCT 35.9* 33.7* 35.4*  MCV 102.0* 103.4* 103.5*  PLT 92* 95* 128*   Basic Metabolic Panel:  Recent Labs Lab 11/06/16 1933 11/08/16 0533  NA  140 139  K 3.3* 3.9  CL 107 107  CO2 22 27  GLUCOSE 101* 101*  BUN 16 9  CREATININE 0.81 0.70  CALCIUM 9.0 8.3*  MG 1.7 1.9   GFR: Estimated Creatinine Clearance: 105.6 mL/min (by C-G formula based on SCr of 0.7 mg/dL).  Liver Function Tests:  Recent Labs Lab 11/06/16 1933 11/08/16 0533  AST 45* 30  ALT 31 21  ALKPHOS 64 54  BILITOT 1.3* 0.3  PROT 7.3 6.1*  ALBUMIN 4.0 3.0*     Recent Results (from the past 240 hour(s))  Blood culture (routine x 2)     Status: None (Preliminary result)   Collection Time: 11/06/16  7:33 PM  Result Value Ref Range Status   Specimen Description BLOOD LEFT HAND  Final   Special Requests   Final    BOTTLES DRAWN AEROBIC AND ANAEROBIC Blood Culture adequate volume   Culture NO GROWTH 2 DAYS  Final   Report Status PENDING  Incomplete  Blood culture (routine x 2)     Status: None  (Preliminary result)   Collection Time: 11/06/16  7:33 PM  Result Value Ref Range Status   Specimen Description LEFT ANTECUBITAL  Final   Special Requests   Final    BOTTLES DRAWN AEROBIC AND ANAEROBIC Blood Culture adequate volume   Culture NO GROWTH 2 DAYS  Final   Report Status PENDING  Incomplete  Urine culture     Status: None   Collection Time: 11/06/16  8:32 PM  Result Value Ref Range Status   Specimen Description URINE, CLEAN CATCH  Final   Special Requests NONE  Final   Culture   Final    NO GROWTH Performed at [facility], [address] [place], [place] [address]    Report Status 11/08/2016 FINAL  Final  MRSA PCR Screening     Status: Abnormal   Collection Time: 11/07/16  1:56 AM  Result Value Ref Range Status   MRSA by PCR POSITIVE (A) NEGATIVE Final    Comment:        The GeneXpert MRSA Assay (FDA approved for NASAL specimens only), is one component of a comprehensive MRSA colonization surveillance program. It is not intended to diagnose MRSA infection nor to guide or monitor treatment for MRSA infections. RESULT CALLED TO, READ BACK BY AND VERIFIED WITH: LIGHTNER,N @ 0418 BY MATTHEWS,B 8.22.18      Scheduled Meds: . Chlorhexidine Gluconate Cloth  6 each Topical Q0600  . folic acid  1 mg Oral Daily  . LORazepam  0-4 mg Intravenous Q6H   Followed by  . [START ON 11/09/2016] LORazepam  0-4 mg Intravenous Q12H  . multivitamin with minerals  1 tablet Oral Daily  . mupirocin ointment  1 application Nasal BID  . thiamine  100 mg Oral Daily     LOS: 0 days   Lonia Blood, MD [facility] Office  [phone] Pager - Text Page per Amion as per below:  On-Call/Text Page:      [email or web address]      password [email or web address]  If 7PM-7AM, please contact night-coverage [email or web address] Password [facility] 11/08/2016, 10:19 AM

## 2016-11-08 NOTE — Progress Notes (Signed)
OT Cancellation Note  Patient Details Name: Matthew Brady MRN: [id number] DOB: 1960/08/17   Cancelled Treatment:    Reason Eval/Treat Not Completed: OT screened, no needs identified, will sign off. Chart reviewed, pt screened for OT needs. Pt from jail, homeless. Reports he lives with his girlfriend, however per chart review girlfriend is also homeless. Pt is independent in B/ADL completion. Complaints are back and leg pain which limit his standing activity tolerance at times. Pt is at baseline with ADL completion, no further OT services required at this time.    Ezra Sites, OTR/L  [phone] 11/08/2016, 9:11 AM

## 2016-11-08 NOTE — Progress Notes (Signed)
Physical Therapy Treatment Patient Details Name: Matthew Brady MRN: [id number] DOB: 03/30/1960 Today's Date: 11/08/2016    History of Present Illness Matthew Brady is a 56 y.o. male with history of etoh abuse , chronic pain, homelessness sent to ED from jail where he has been for the last 4 days since 11/02/16.  PT is a heavy alcohol drinker per jail staff and has been going through withdrawal and has been very confused.  At the jail he has been getting po Librium 50 mg bid for this.  Also pt is HOH.      PT Comments    Pt supine in bed and willing to participate with sheriff in room.  Pt independent with bed mobility and transitions to standing, very slow due to pain with cueing for handplacement for increased ease and safety.  Increased distance with gait training with cueing to stand in walker and to increase stride length for more normalized gait mechanics.  Pt limited by fatigue and pain with task.  EOS left in bed with call bell within reach and RN/sheriff in rom.    Follow Up Recommendations  Supervision for mobility/OOB     Equipment Recommendations  Rolling walker with 5" wheels    Recommendations for Other Services       Precautions / Restrictions Precautions Precautions: Fall Restrictions Weight Bearing Restrictions: No    Mobility  Bed Mobility Overal bed mobility: Modified Independent       Supine to sit: Supervision Sit to supine: Supervision   General bed mobility comments: able to complete independently wiht increased time due to pain/tightness  Transfers Overall transfer level: Modified independent Equipment used: Rolling walker (2 wheeled) (cueing for handplacement to assist)                Ambulation/Gait Ambulation/Gait assistance: Min guard Ambulation Distance (Feet): 33 Feet Assistive device: Rolling walker (2 wheeled) Gait Pattern/deviations: Decreased step length - right;Decreased step length - left;Decreased dorsiflexion -  right;Decreased dorsiflexion - left Gait velocity: slow   General Gait Details: Cueing to pull RW closer for safety, posture and to increase stride length for more normalized gait mechanics; 1 LOB while turning around required min A for safety   Stairs            Wheelchair Mobility    Modified Rankin (Stroke Patients Only)       Balance                                            Cognition Arousal/Alertness: Awake/alert Behavior During Therapy: WFL for tasks assessed/performed Overall Cognitive Status: Within Functional Limits for tasks assessed                                        Exercises General Exercises - Lower Extremity Ankle Circles/Pumps: AROM;Both;10 reps    General Comments        Pertinent Vitals/Pain Pain Assessment: No/denies pain Pain Score: 7  Pain Location: chest pain intermittent sharp; Bil knee pain achey tightness Pain Descriptors / Indicators: Aching Pain Intervention(s): Limited activity within patient's tolerance;Monitored during session;Repositioned;Premedicated before session    Home Living                      Prior Function  PT Goals (current goals can now be found in the care plan section) Progress towards PT goals: Progressing toward goals    Frequency    Min 3X/week      PT Plan      Co-evaluation              AM-PAC PT "6 Clicks" Daily Activity  Outcome Measure  Difficulty turning over in bed (including adjusting bedclothes, sheets and blankets)?: A Little Difficulty moving from lying on back to sitting on the side of the bed? : A Little Difficulty sitting down on and standing up from a chair with arms (e.g., wheelchair, bedside commode, etc,.)?: A Little Help needed moving to and from a bed to chair (including a wheelchair)?: A Little Help needed walking in hospital room?: A Little Help needed climbing 3-5 steps with a railing? : A Lot 6 Click Score:  17    End of Session Equipment Utilized During Treatment: Gait belt Activity Tolerance: Patient limited by pain Patient left: in bed;with nursing/sitter in room;with restraints reapplied (restraints applied by sheriff in room)   PT Visit Diagnosis: Unsteadiness on feet (R26.81);Other abnormalities of gait and mobility (R26.89);Difficulty in walking, not elsewhere classified (R26.2)     Time: 1594-5859 PT Time Calculation (min) (ACUTE ONLY): 18 min  Charges:  $Gait Training: 8-22 mins                    G Codes:      Becky Sax, LPTA; CBIS [phone]  Juel Burrow 11/08/2016, 6:12 PM

## 2016-11-08 NOTE — Plan of Care (Signed)
Problem: Safety: Goal: Ability to remain free from injury will improve Outcome: Progressing Seizure precatutions in place, bed alarm on, pt educated on calling for assistance upon ambulation.

## 2016-11-09 DIAGNOSIS — F10921 Alcohol use, unspecified with intoxication delirium: Secondary | ICD-10-CM | POA: Diagnosis not present

## 2016-11-09 LAB — COMPREHENSIVE METABOLIC PANEL
ALBUMIN: 3.1 g/dL — AB (ref 3.5–5.0)
ALT: 21 U/L (ref 17–63)
ANION GAP: 6 (ref 5–15)
AST: 27 U/L (ref 15–41)
Alkaline Phosphatase: 62 U/L (ref 38–126)
BUN: 7 mg/dL (ref 6–20)
CHLORIDE: 105 mmol/L (ref 101–111)
CO2: 26 mmol/L (ref 22–32)
Calcium: 8.2 mg/dL — ABNORMAL LOW (ref 8.9–10.3)
Creatinine, Ser: 0.65 mg/dL (ref 0.61–1.24)
GFR calc non Af Amer: >60 mL/min (ref >60)
Glucose, Bld: 104 mg/dL — ABNORMAL HIGH (ref 65–99)
Potassium: 3.8 mmol/L (ref 3.5–5.1)
SODIUM: 137 mmol/L (ref 135–145)
Total Bilirubin: 0.3 mg/dL (ref 0.3–1.2)
Total Protein: 6.2 g/dL — ABNORMAL LOW (ref 6.5–8.1)

## 2016-11-09 LAB — PHOSPHORUS: PHOSPHORUS: 4 mg/dL (ref 2.5–4.6)

## 2016-11-09 LAB — MAGNESIUM: Magnesium: 1.9 mg/dL (ref 1.7–2.4)

## 2016-11-09 MED ORDER — CHLORDIAZEPOXIDE HCL 25 MG PO CAPS: ORAL_CAPSULE | ORAL | 0 refills | Status: DC

## 2016-11-09 MED ORDER — CLONIDINE HCL 0.1 MG PO TABS: 0.1000 mg | ORAL_TABLET | Freq: Three times a day (TID) | ORAL | 0 refills | Status: DC

## 2016-11-09 MED ORDER — ACETAMINOPHEN 500 MG PO TABS: 500.0000 mg | ORAL_TABLET | Freq: Four times a day (QID) | ORAL | Status: AC | PRN

## 2016-11-09 MED ORDER — CHLORDIAZEPOXIDE HCL 25 MG PO CAPS
25.0000 mg | ORAL_CAPSULE | Freq: Two times a day (BID) | ORAL | Status: DC
  Administered 2016-11-09: 25 mg via ORAL
  Filled 2016-11-09: qty 1

## 2016-11-09 MED ORDER — ADULT MULTIVITAMIN W/MINERALS CH: 1.0000 | ORAL_TABLET | Freq: Every day | ORAL | Status: DC

## 2016-11-09 MED ORDER — CHLORDIAZEPOXIDE HCL 5 MG PO CAPS: 20.0000 mg | ORAL_CAPSULE | Freq: Two times a day (BID) | ORAL | Status: DC

## 2016-11-09 NOTE — Discharge Planning (Signed)
Patient IV removed.  Discharge papers given, explained and educated.  Report called to [facility] [place].  Sent printed copy of facesheet, DC summary notes and discharge papers + printed/signed scripts (per facility RN request).  In formed of suggested FU appt and will be seen in facility when applicable.  Once ready, will be wheeled to front and taken back to jail by current guard.

## 2016-11-09 NOTE — Care Management (Signed)
CM discussed RW recommendation with patient and guard. Per guard, patient is ineligible to have RW in jail. Bedside RN working on DC. Patient will DC today back to jail in custody of guard.

## 2016-11-09 NOTE — Discharge Summary (Signed)
DISCHARGE SUMMARY  Matthew Brady  MR#: [id number]  DOB:[birth year]  Date of Admission: 11/06/2016 Date of Discharge: 11/09/2016  Attending Physician:MCCLUNG,JEFFREY T  Patient's Matthew Brady, [name]  Consults:  None   Disposition: D/C to custody of Sheriff Department (pt is incarcerated)  Follow-up Appts: Follow-up Information    Primary Care MD of your choice Follow up.   Why:  Obtain a Primary Care Physician when you are released from jail to establish ongoing medical care.            Discharge Diagnoses: Acute alcohol withdrawal Thrombocytopenia Hypokalemia Macrocytic anemia Chronic pain MRSA screen +  Initial presentation: 56 y.o.malewith history of heavy etoh abuse, chronic pain, and homelessness who was sent to the ED 8/21 from jail where he had been since 11/02/16 when he began to display signs of withdrawal and severe confusion despite getting po Librium 50 mg bid.  In the ED Head CT, CXR and UA were wnl. He was given IV ativan per CIWA protocol which controlled his sx.  Hospital Course:  Acute alcohol withdrawal Ativan was administered as per CIWA protocol - the pt's withdrawal sx were well controleld w/ use of the CIWA - at the time of d/c there were no outward signs/symptoms of ongoing severe withdrawal - the pt was alert and conversant and calm - counseled patient on absolute need to discontinue alcohol use on numerous occasions during his hospital stay, explaining to him that ongoing use will result in his death - as a precaution against recurring withdrawal will continue a rapid librium taper post d/c (the Sheriff's office is able to provide librium in the jail in which the pt will be housed)  Thrombocytopenia due to alcohol abuse - steadily improved in the absence of alcohol   Hypokalemia Replaced to normal range - Mg is normal   Macrocytic anemia Most likely due to alcohol abuse - B-12 and folic acid levels within a normal range    Chronic pain Does not appear to be severe at the present time - pattern does not fit a particular unifying etiology - no narcotics being prescribed at time of discharge - patient encouraged to increase his mobility  MRSA screen +  Allergies as of 11/09/2016   No Known Allergies     Medication List    TAKE these medications   acetaminophen 500 MG tablet Commonly known as:  TYLENOL Take 1 tablet (500 mg total) by mouth every 6 (six) hours as needed for mild pain (or Fever >/= 101).   chlordiazePOXIDE 25 MG capsule Commonly known as:  LIBRIUM Take one pill 2x a day for 3 days, then one pill a day for 3 days, then stop What changed:  how much to take  how to take this  when to take this  additional instructions   cloNIDine 0.1 MG tablet Commonly known as:  CATAPRES Take 1 tablet (0.1 mg total) by mouth 3 (three) times daily.   multivitamin with minerals Tabs tablet Take 1 tablet by mouth daily.            Discharge Care Instructions        Start     Ordered   11/09/16 0000  acetaminophen (TYLENOL) 500 MG tablet  Every 6 hours PRN     11/09/16 1011   11/09/16 0000  chlordiazePOXIDE (LIBRIUM) 25 MG capsule     11/09/16 1011   11/09/16 0000  cloNIDine (CATAPRES) 0.1 MG tablet  3 times daily  11/09/16 1011   11/09/16 0000  Multiple Vitamin (MULTIVITAMIN WITH MINERALS) TABS tablet  Daily     11/09/16 1011      Day of Discharge BP (!) 148/86 (BP Location: Right Arm)   Pulse 62   Temp 98.1 F (36.7 C) (Oral)   Resp 20   Ht 5\' 9"  (1.753 m)   Wt 76.2 kg (167 lb 15.9 oz)   SpO2 100%   BMI 24.81 kg/m   Physical Exam: General: No acute respiratory distress - awake, alert, and conversant  Lungs: Clear to auscultation bilaterally without wheezes or crackles Cardiovascular: Regular rate and rhythm without murmur gallop or rub normal S1 and S2 Abdomen: Nontender, nondistended, soft, bowel sounds positive, no rebound, no ascites, no appreciable  mass Extremities: No significant cyanosis, clubbing, or edema bilateral lower extremities  Basic Metabolic Panel:  Recent Labs Lab 11/06/16 1933 11/08/16 0533 11/09/16 0714  NA 140 139 137  K 3.3* 3.9 3.8  CL 107 107 105  CO2 22 27 26   GLUCOSE 101* 101* 104*  BUN 16 9 7   CREATININE 0.81 0.70 0.65  CALCIUM 9.0 8.3* 8.2*  MG 1.7 1.9 1.9  PHOS  --   --  4.0    Liver Function Tests:  Recent Labs Lab 11/06/16 1933 11/08/16 0533 11/09/16 0714  AST 45* 30 27  ALT 31 21 21   ALKPHOS 64 54 62  BILITOT 1.3* 0.3 0.3  PROT 7.3 6.1* 6.2*  ALBUMIN 4.0 3.0* 3.1*    CBC:  Recent Labs Lab 11/06/16 1933 11/07/16 0404 11/08/16 0533  WBC 13.9* 10.4 5.9  NEUTROABS 11.0*  --   --   HGB 12.5* 11.6* 12.1*  HCT 35.9* 33.7* 35.4*  MCV 102.0* 103.4* 103.5*  PLT 92* 95* 128*    Recent Results (from the past 240 hour(s))  Blood culture (routine x 2)     Status: None (Preliminary result)   Collection Time: 11/06/16  7:33 PM  Result Value Ref Range Status   Specimen Description BLOOD LEFT HAND  Final   Special Requests   Final    BOTTLES DRAWN AEROBIC AND ANAEROBIC Blood Culture adequate volume   Culture NO GROWTH 3 DAYS  Final   Report Status PENDING  Incomplete  Blood culture (routine x 2)     Status: None (Preliminary result)   Collection Time: 11/06/16  7:33 PM  Result Value Ref Range Status   Specimen Description LEFT ANTECUBITAL  Final   Special Requests   Final    BOTTLES DRAWN AEROBIC AND ANAEROBIC Blood Culture adequate volume   Culture NO GROWTH 3 DAYS  Final   Report Status PENDING  Incomplete  Urine culture     Status: None   Collection Time: 11/06/16  8:32 PM  Result Value Ref Range Status   Specimen Description URINE, CLEAN CATCH  Final   Special Requests NONE  Final   Culture   Final    NO GROWTH Performed at [facility], [address], [place], [place] [address]    Report Status 11/08/2016 FINAL  Final  MRSA PCR Screening     Status: Abnormal    Collection Time: 11/07/16  1:56 AM  Result Value Ref Range Status   MRSA by PCR POSITIVE (A) NEGATIVE Final    Comment:        The GeneXpert MRSA Assay (FDA approved for NASAL specimens only), is one component of a comprehensive MRSA colonization surveillance program. It is not intended to diagnose MRSA  infection nor to guide or monitor treatment for MRSA infections. RESULT CALLED TO, READ BACK BY AND VERIFIED WITH: LIGHTNER,N @ 0418 BY MATTHEWS,B 8.22.18      Time spent in discharge (includes decision making & examination of pt): 35 minutes  11/09/2016, 10:13 AM   Lonia Blood, MD [facility] Office  [phone] Pager [phone]  On-Call/Text Page:      [email or web address]      password [facility]

## 2016-11-09 NOTE — Clinical Social Work Note (Signed)
Patient was referred to CSW for possible placement/housing issues as it was considered that patient may be released from jail while he was hospitalized. Patient is being discharged and returning to jail where his jail case manager will address ongoing needs.    LCSW signing off.     Rosalene Wardrop, Juleen China, LCSW

## 2016-11-09 NOTE — Discharge Instructions (Signed)
Alcohol Withdrawal When a person who drinks a lot of alcohol stops drinking, he or she may go through alcohol withdrawal. Alcohol withdrawal causes problems. It can make you feel:  Tired (fatigued).  Sad (depressed).  Fearful (anxious).  Grouchy (irritable).  Not hungry.  Sick to your stomach (nauseous).  Shaky.  It can also make you have:  Nightmares.  Trouble sleeping.  Trouble thinking clearly.  Mood swings.  Clammy skin.  Very bad sweating.  A very fast heartbeat.  Shaking that you cannot control (tremor).  Having a fever.  A fit of movements that you cannot control (seizure).  Confusion.  Throwing up (vomiting).  Feeling or seeing things that are not there (hallucinations).  Follow these instructions at home:  Take medicines and vitamins only as told by your doctor.  Do not drink alcohol.  Have someone around in case you need help.  Drink enough fluid to keep your pee (urine) clear or pale yellow.  Think about joining a group to help you stop drinking. Contact a doctor if:  Your problems get worse.  Your problems do not go away.  You cannot eat or drink without throwing up.  You are having a hard time not drinking alcohol.  You cannot stop drinking alcohol. Get help right away if:  You feel your heart beating differently than usual.  Your chest hurts.  You have trouble breathing.  You have very bad problems, like: ? A fever. ? A fit of movements that you cannot control. ? Being very confused. ? Feeling or seeing things that are not there. This information is not intended to replace advice given to you by your health care provider. Make sure you discuss any questions you have with your health care provider. Document Released: 08/22/2007 Document Revised: 08/11/2015 Document Reviewed: 12/22/2013 Elsevier Interactive Patient Education  2018 [employer] Inc[employer]  

## 2016-11-09 NOTE — Progress Notes (Signed)
PT Cancellation Note  Patient Details Name: Matthew Brady MRN: [id number] DOB: 1961-01-22   Cancelled Treatment:    Reason Eval/Treat Not Completed:  (Patient declined stating he is being discharged today)   10:34 AM, 11/09/16 Ocie Bob, MPT Physical Therapist with [facility] [phone] office [phone] mobile phone

## 2016-11-11 LAB — CULTURE, BLOOD (ROUTINE X 2)
CULTURE: NO GROWTH
CULTURE: NO GROWTH
Special Requests: ADEQUATE
Special Requests: ADEQUATE

## 2017-11-30 IMAGING — CR DG CHEST 1V
1 series · 1 of 1 positions shown · non-contrast
Comparison: 08/10/2015

CLINICAL DATA: Altered mental status, current smoker Create

EXAM:
CHEST 1 VIEW

[ap portable]
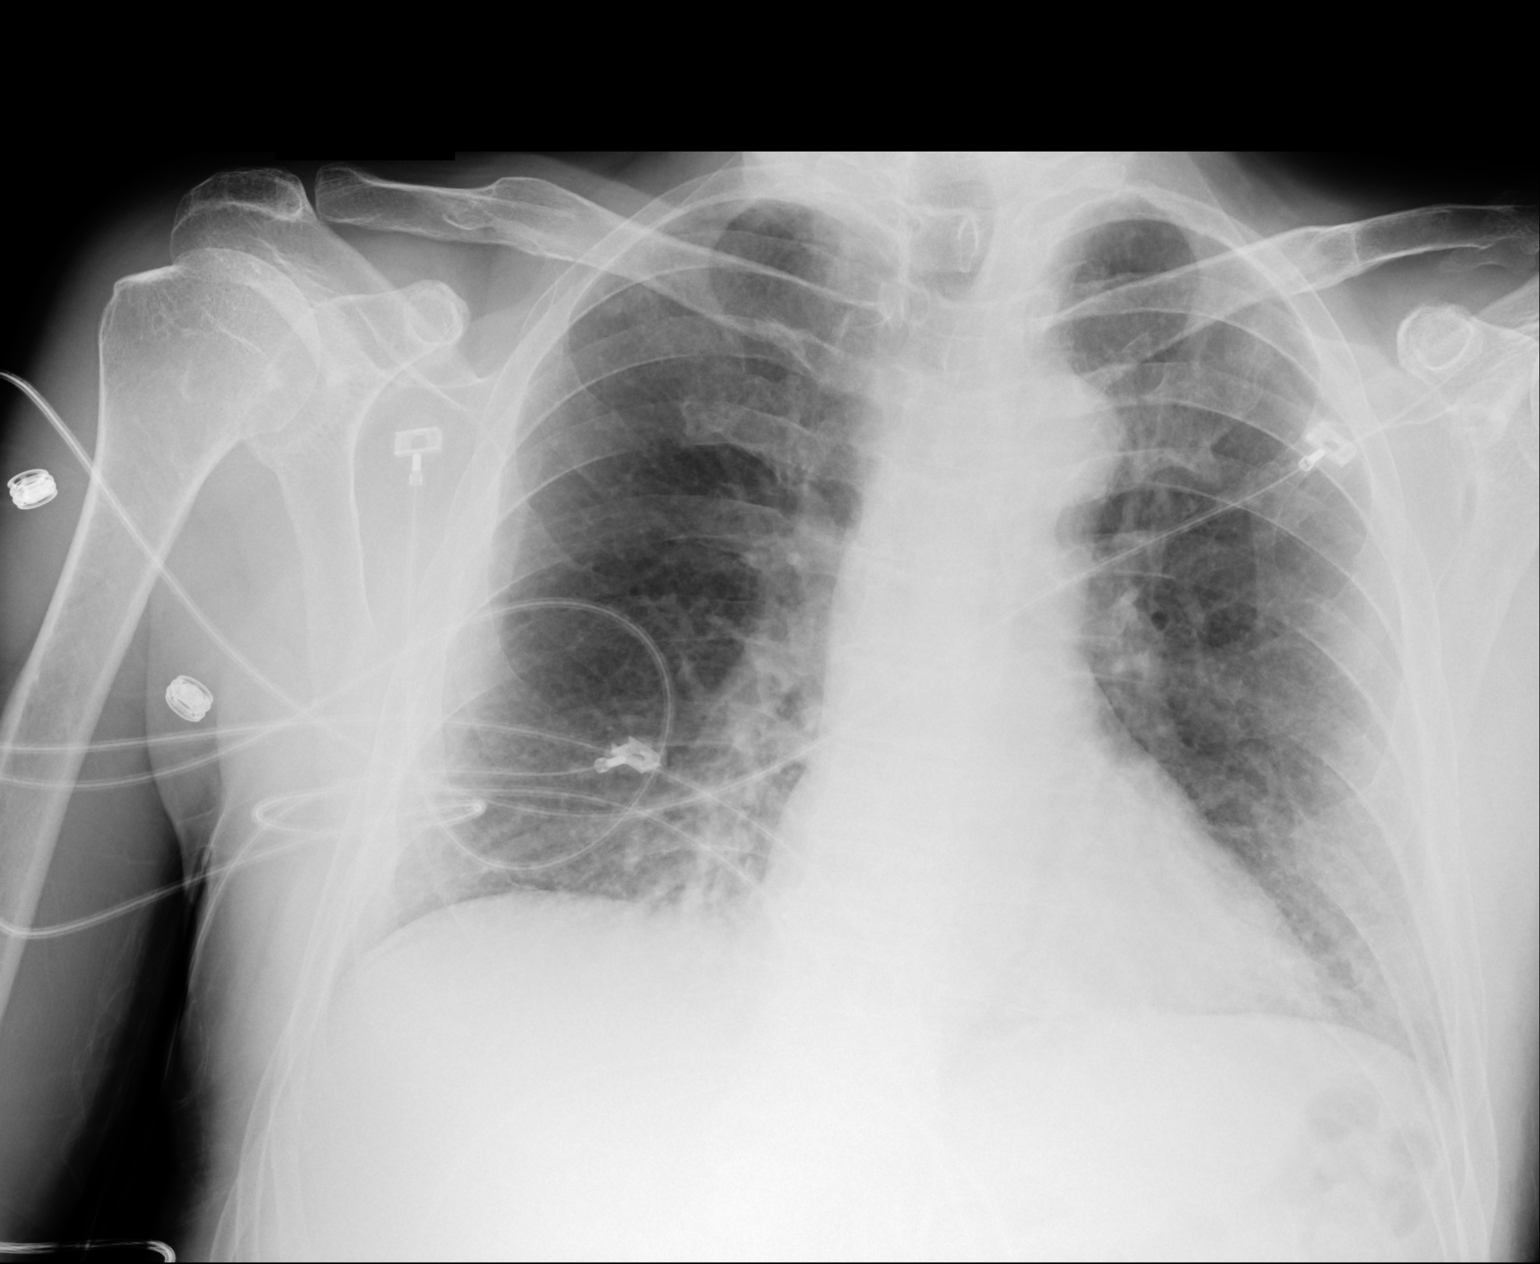

[1 of 1 positions shown; findings below may reference images not displayed]

FINDINGS: The heart size is within normal limits. There is aortic
atherosclerosis at the arch without aneurysm. There is bibasilar
atelectasis with without pneumonic consolidation. No overt pulmonary
edema, effusion or pneumothorax. No acute osseous abnormality.
IMPRESSION: Aortic atherosclerosis.  Bibasilar atelectasis.

## 2022-05-18 ENCOUNTER — Emergency Department (HOSPITAL_COMMUNITY): Payer: Self-pay

## 2022-05-18 ENCOUNTER — Emergency Department (HOSPITAL_COMMUNITY)
Admission: EM | Admit: 2022-05-18 | Discharge: 2022-05-18 | Disposition: A | Discharge status: Home / Self Care | Payer: Self-pay | Attending: Emergency Medicine | Admitting: Emergency Medicine

## 2022-05-18 ENCOUNTER — Emergency Department (HOSPITAL_COMMUNITY)
Admission: EM | Admit: 2022-05-18 | Discharge: 2022-05-18 | Discharge status: Against Medical Advice | Payer: Self-pay | Attending: Emergency Medicine | Admitting: Emergency Medicine

## 2022-05-18 ENCOUNTER — Encounter (HOSPITAL_COMMUNITY): Payer: Self-pay | Admitting: Pharmacy Technician

## 2022-05-18 ENCOUNTER — Other Ambulatory Visit: Payer: Self-pay

## 2022-05-18 DIAGNOSIS — Z5329 Procedure and treatment not carried out because of patient's decision for other reasons: Secondary | ICD-10-CM | POA: Insufficient documentation

## 2022-05-18 DIAGNOSIS — Y908 Blood alcohol level of 240 mg/100 ml or more: Secondary | ICD-10-CM | POA: Insufficient documentation

## 2022-05-18 DIAGNOSIS — E860 Dehydration: Secondary | ICD-10-CM | POA: Insufficient documentation

## 2022-05-18 DIAGNOSIS — W1830XA Fall on same level, unspecified, initial encounter: Secondary | ICD-10-CM | POA: Insufficient documentation

## 2022-05-18 DIAGNOSIS — W19XXXA Unspecified fall, initial encounter: Secondary | ICD-10-CM | POA: Insufficient documentation

## 2022-05-18 DIAGNOSIS — Y906 Blood alcohol level of 120-199 mg/100 ml: Secondary | ICD-10-CM | POA: Insufficient documentation

## 2022-05-18 DIAGNOSIS — S52572A Other intraarticular fracture of lower end of left radius, initial encounter for closed fracture: Secondary | ICD-10-CM | POA: Insufficient documentation

## 2022-05-18 DIAGNOSIS — F1010 Alcohol abuse, uncomplicated: Secondary | ICD-10-CM | POA: Insufficient documentation

## 2022-05-18 DIAGNOSIS — R000 Tachycardia, unspecified: Secondary | ICD-10-CM | POA: Insufficient documentation

## 2022-05-18 DIAGNOSIS — S52502A Unspecified fracture of the lower end of left radius, initial encounter for closed fracture: Secondary | ICD-10-CM | POA: Insufficient documentation

## 2022-05-18 LAB — CBC WITH DIFFERENTIAL/PLATELET
Abs Immature Granulocytes: 0.04 10*3/uL (ref 0.00–0.07)
Abs Immature Granulocytes: 0.01 10*3/uL (ref 0.00–0.07)
Basophils Absolute: 0 10*3/uL (ref 0.0–0.1)
Basophils Absolute: 0 10*3/uL (ref 0.0–0.1)
Basophils Relative: 0 %
Basophils Relative: 0 %
Eosinophils Absolute: 0.1 10*3/uL (ref 0.0–0.5)
Eosinophils Absolute: 0.2 10*3/uL (ref 0.0–0.5)
Eosinophils Relative: 1 %
Eosinophils Relative: 2 %
HCT: 41.1 % (ref 39.0–52.0)
HCT: 43 % (ref 39.0–52.0)
Hemoglobin: 13.7 g/dL (ref 13.0–17.0)
Hemoglobin: 14.7 g/dL (ref 13.0–17.0)
Immature Granulocytes: 0 %
Immature Granulocytes: 0 %
Lymphocytes Relative: 23 %
Lymphocytes Relative: 25 %
Lymphs Abs: 2.4 10*3/uL (ref 0.7–4.0)
Lymphs Abs: 2.5 10*3/uL (ref 0.7–4.0)
MCH: 34.3 pg — ABNORMAL HIGH (ref 26.0–34.0)
MCH: 34.9 pg — ABNORMAL HIGH (ref 26.0–34.0)
MCHC: 33.3 g/dL (ref 30.0–36.0)
MCHC: 34.2 g/dL (ref 30.0–36.0)
MCV: 102.8 fL — ABNORMAL HIGH (ref 80.0–100.0)
MCV: 102.1 fL — ABNORMAL HIGH (ref 80.0–100.0)
Monocytes Absolute: 2 10*3/uL — ABNORMAL HIGH (ref 0.1–1.0)
Monocytes Absolute: 2 10*3/uL — ABNORMAL HIGH (ref 0.1–1.0)
Monocytes Relative: 19 %
Monocytes Relative: 21 %
Neutro Abs: 5.9 10*3/uL (ref 1.7–7.7)
Neutro Abs: 5 10*3/uL (ref 1.7–7.7)
Neutrophils Relative %: 57 %
Neutrophils Relative %: 52 %
Platelets: 231 10*3/uL (ref 150–400)
Platelets: 262 10*3/uL (ref 150–400)
RBC: 4 MIL/uL — ABNORMAL LOW (ref 4.22–5.81)
RBC: 4.21 MIL/uL — ABNORMAL LOW (ref 4.22–5.81)
RDW: 13.1 % (ref 11.5–15.5)
RDW: 13.1 % (ref 11.5–15.5)
WBC: 10.6 10*3/uL — ABNORMAL HIGH (ref 4.0–10.5)
WBC: 9.8 10*3/uL (ref 4.0–10.5)
nRBC: 0 % (ref 0.0–0.2)
nRBC: 0 % (ref 0.0–0.2)

## 2022-05-18 LAB — RAPID URINE DRUG SCREEN, HOSP PERFORMED
Amphetamines: NOT DETECTED
Barbiturates: NOT DETECTED
Benzodiazepines: NOT DETECTED
Cocaine: NOT DETECTED
Opiates: NOT DETECTED
Tetrahydrocannabinol: NOT DETECTED

## 2022-05-18 LAB — COMPREHENSIVE METABOLIC PANEL
ALT: 18 U/L (ref 0–44)
ALT: 21 U/L (ref 0–44)
AST: 27 U/L (ref 15–41)
AST: 31 U/L (ref 15–41)
Albumin: 3.7 g/dL (ref 3.5–5.0)
Albumin: 4.3 g/dL (ref 3.5–5.0)
Alkaline Phosphatase: 66 U/L (ref 38–126)
Alkaline Phosphatase: 76 U/L (ref 38–126)
Anion gap: 9 (ref 5–15)
Anion gap: 14 (ref 5–15)
BUN: 9 mg/dL (ref 6–20)
BUN: 10 mg/dL (ref 6–20)
CO2: 24 mmol/L (ref 22–32)
CO2: 22 mmol/L (ref 22–32)
Calcium: 8.5 mg/dL — ABNORMAL LOW (ref 8.9–10.3)
Calcium: 8.7 mg/dL — ABNORMAL LOW (ref 8.9–10.3)
Chloride: 106 mmol/L (ref 98–111)
Chloride: 105 mmol/L (ref 98–111)
Creatinine, Ser: 0.78 mg/dL (ref 0.61–1.24)
Creatinine, Ser: 0.8 mg/dL (ref 0.61–1.24)
GFR, Estimated: >60 mL/min (ref >60)
GFR, Estimated: >60 mL/min (ref >60)
Glucose, Bld: 93 mg/dL (ref 70–99)
Glucose, Bld: 83 mg/dL (ref 70–99)
Potassium: 4.2 mmol/L (ref 3.5–5.1)
Potassium: 3.2 mmol/L — ABNORMAL LOW (ref 3.5–5.1)
Sodium: 139 mmol/L (ref 135–145)
Sodium: 141 mmol/L (ref 135–145)
Total Bilirubin: 0.3 mg/dL (ref 0.3–1.2)
Total Bilirubin: 0.6 mg/dL (ref 0.3–1.2)
Total Protein: 7.2 g/dL (ref 6.5–8.1)
Total Protein: 8.2 g/dL — ABNORMAL HIGH (ref 6.5–8.1)

## 2022-05-18 LAB — ACETAMINOPHEN LEVEL: Acetaminophen (Tylenol), Serum: 12 ug/mL (ref 10–30)

## 2022-05-18 LAB — URINALYSIS, ROUTINE W REFLEX MICROSCOPIC
Bilirubin Urine: NEGATIVE
Glucose, UA: NEGATIVE mg/dL
Hgb urine dipstick: NEGATIVE
Ketones, ur: NEGATIVE mg/dL
Leukocytes,Ua: NEGATIVE
Nitrite: NEGATIVE
Protein, ur: NEGATIVE mg/dL
Specific Gravity, Urine: 1.009 (ref 1.005–1.030)
pH: 5 (ref 5.0–8.0)

## 2022-05-18 LAB — ETHANOL
Alcohol, Ethyl (B): 240 mg/dL — ABNORMAL HIGH (ref <10)
Alcohol, Ethyl (B): 196 mg/dL — ABNORMAL HIGH (ref <10)

## 2022-05-18 LAB — SALICYLATE LEVEL: Salicylate Lvl: 16.2 mg/dL (ref 7.0–30.0)

## 2022-05-18 LAB — CBG MONITORING, ED: Glucose-Capillary: 87 mg/dL (ref 70–99)

## 2022-05-18 LAB — CK: Total CK: 403 U/L — ABNORMAL HIGH (ref 49–397)

## 2022-05-18 MED ORDER — IBUPROFEN 600 MG PO TABS: 600.0000 mg | ORAL_TABLET | Freq: Four times a day (QID) | ORAL | 0 refills | Status: AC | PRN

## 2022-05-18 MED ORDER — HYDROCODONE-ACETAMINOPHEN 5-325 MG PO TABS
1.0000 | ORAL_TABLET | Freq: Once | ORAL | Status: AC
  Administered 2022-05-18: 1 via ORAL
  Filled 2022-05-18: qty 1

## 2022-05-18 MED ORDER — IBUPROFEN 800 MG PO TABS
800.0000 mg | ORAL_TABLET | Freq: Once | ORAL | Status: AC
  Administered 2022-05-18: 800 mg via ORAL
  Filled 2022-05-18: qty 1

## 2022-05-18 MED ORDER — HYDROCODONE-ACETAMINOPHEN 5-325 MG PO TABS: 1.0000 | ORAL_TABLET | ORAL | 0 refills | Status: AC | PRN

## 2022-05-18 MED ORDER — SODIUM CHLORIDE 0.9 % IV BOLUS
1000.0000 mL | Freq: Once | INTRAVENOUS | Status: AC
  Administered 2022-05-18: 1000 mL via INTRAVENOUS

## 2022-05-18 MED ORDER — SODIUM CHLORIDE 0.9 % IV SOLN: INTRAVENOUS | Status: DC

## 2022-05-18 NOTE — ED Provider Notes (Signed)
[facility] Provider Note   CSN: [id number] Arrival date & time: 05/18/22  1718     History  Chief Complaint  Patient presents with   Generalized Body Aches    Matthew Brady is a 62 y.o. male.  Pt is a 62 yo male with a pmhx significant for etoh abuse and chronic pain.  Pt was in the ED earlier today for a fall.  He had x-rays done, but he left before he was seen.  EMS was called to his home today because of continued pain.  Pt is disheveled and smells strongly of alcohol, urine and feces.  Per EMS, his home was disgusting.  Nursing staff had to take pt to the decontamination room to clean him up.       Home Medications Prior to Admission medications   Medication Sig Start Date End Date Taking? Authorizing Provider  HYDROcodone-acetaminophen (NORCO/VICODIN) 5-325 MG tablet Take 1 tablet by mouth every 4 (four) hours as needed. 05/18/22  Yes Isla Pence, MD  ibuprofen (ADVIL) 600 MG tablet Take 1 tablet (600 mg total) by mouth every 6 (six) hours as needed. 05/18/22  Yes Isla Pence, MD  acetaminophen (TYLENOL) 500 MG tablet Take 1 tablet (500 mg total) by mouth every 6 (six) hours as needed for mild pain (or Fever >/= 101). 11/09/16   Cherene Altes, MD  chlordiazePOXIDE (LIBRIUM) 25 MG capsule Take one pill 2x a day for 3 days, then one pill a day for 3 days, then stop Patient not taking: Reported on 05/18/2022 11/09/16   Cherene Altes, MD  cloNIDine (CATAPRES) 0.1 MG tablet Take 1 tablet (0.1 mg total) by mouth 3 (three) times daily. Patient not taking: Reported on 05/18/2022 11/09/16   Cherene Altes, MD  Multiple Vitamin (MULTIVITAMIN WITH MINERALS) TABS tablet Take 1 tablet by mouth daily. Patient not taking: Reported on 05/18/2022 11/09/16   Cherene Altes, MD      Allergies    Patient has no known allergies.    Review of Systems   Review of Systems  Musculoskeletal:        Left wrist pain  All other systems  reviewed and are negative.   Physical Exam Updated Vital Signs BP (!) 148/99   Pulse 94   Temp 98.1 F (36.7 C)   Resp 19   Ht '5\' 11"'$  (1.803 m)   Wt 80.7 kg   SpO2 100%   BMI 24.83 kg/m  Physical Exam Vitals and nursing note reviewed.  Constitutional:      Appearance: Normal appearance.  HENT:     Head: Normocephalic and atraumatic.     Right Ear: External ear normal.     Left Ear: External ear normal.     Nose: Nose normal.     Mouth/Throat:     Mouth: Mucous membranes are dry.  Eyes:     Extraocular Movements: Extraocular movements intact.     Conjunctiva/sclera: Conjunctivae normal.     Pupils: Pupils are equal, round, and reactive to light.  Cardiovascular:     Rate and Rhythm: Regular rhythm. Tachycardia present.     Pulses: Normal pulses.     Heart sounds: Normal heart sounds.  Pulmonary:     Effort: Pulmonary effort is normal.     Breath sounds: Normal breath sounds.  Abdominal:     General: Abdomen is flat. Bowel sounds are normal.     Palpations: Abdomen is soft.  Musculoskeletal:  Cervical back: Normal range of motion and neck supple.     Comments: Left wrist pain   Skin:    General: Skin is warm.     Capillary Refill: Capillary refill takes less than 2 seconds.  Neurological:     General: No focal deficit present.     Mental Status: He is alert and oriented to person, place, and time.  Psychiatric:        Mood and Affect: Mood normal.        Behavior: Behavior normal.     ED Results / Procedures / Treatments   Labs (all labs ordered are listed, but only abnormal results are displayed) Labs Reviewed  COMPREHENSIVE METABOLIC PANEL - Abnormal; Notable for the following components:      Result Value   Potassium 3.2 (*)    Calcium 8.7 (*)    Total Protein 8.2 (*)    All other components within normal limits  ETHANOL - Abnormal; Notable for the following components:   Alcohol, Ethyl (B) 196 (*)    All other components within normal limits   CBC WITH DIFFERENTIAL/PLATELET - Abnormal; Notable for the following components:   RBC 4.21 (*)    MCV 102.1 (*)    MCH 34.9 (*)    Monocytes Absolute 2.0 (*)    All other components within normal limits  CK - Abnormal; Notable for the following components:   Total CK 403 (*)    All other components within normal limits  SALICYLATE LEVEL  ACETAMINOPHEN LEVEL  RAPID URINE DRUG SCREEN, HOSP PERFORMED  URINALYSIS, ROUTINE W REFLEX MICROSCOPIC  CBG MONITORING, ED    EKG EKG Interpretation  Date/Time:  Friday May 18 2022 18:20:17 EST Ventricular Rate:  101 PR Interval:  174 QRS Duration: 90 QT Interval:  347 QTC Calculation: 450 R Axis:   64 Text Interpretation: Sinus tachycardia No significant change since last tracing Confirmed by Isla Pence [phone]) on 05/18/2022 10:02:50 PM  Radiology DG Chest Portable 1 View  Result Date: 05/18/2022 CLINICAL DATA:  Fall EXAM: PORTABLE CHEST 1 VIEW COMPARISON:  11/06/2016 FINDINGS: Lungs are clear.  No pleural effusion or pneumothorax. The heart is normal in size. IMPRESSION: No evidence of acute cardiopulmonary disease. Electronically Signed   By: Zayed Hy M.D.   On: 05/18/2022 18:29   DG Hand Complete Left  Result Date: 05/18/2022 CLINICAL DATA:  Pain and swelling after fall yesterday. EXAM: LEFT HAND - COMPLETE 3+ VIEW; LEFT WRIST - COMPLETE 3+ VIEW COMPARISON:  Left hand x-rays dated April 06, 2015. FINDINGS: Acute nondisplaced fracture of the distal radial metaphysis with intra-articular extension. No additional fracture. No dislocation. Diffuse soft tissue swelling of the wrist extending into the dorsal hand. Mild first CMC joint osteoarthritis. Remaining joint spaces are preserved. IMPRESSION: 1. Acute nondisplaced intra-articular fracture of the distal radius. Electronically Signed   By: Titus Dubin M.D.   On: 05/18/2022 10:53   DG Wrist Complete Left  Result Date: 05/18/2022 CLINICAL DATA:  Pain and swelling after fall  yesterday. EXAM: LEFT HAND - COMPLETE 3+ VIEW; LEFT WRIST - COMPLETE 3+ VIEW COMPARISON:  Left hand x-rays dated April 06, 2015. FINDINGS: Acute nondisplaced fracture of the distal radial metaphysis with intra-articular extension. No additional fracture. No dislocation. Diffuse soft tissue swelling of the wrist extending into the dorsal hand. Mild first CMC joint osteoarthritis. Remaining joint spaces are preserved. IMPRESSION: 1. Acute nondisplaced intra-articular fracture of the distal radius. Electronically Signed   By: Orville Govern.D.  On: 05/18/2022 10:53    Procedures Procedures    Medications Ordered in ED Medications  sodium chloride 0.9 % bolus 1,000 mL (0 mLs Intravenous Stopped 05/18/22 1925)    And  0.9 %  sodium chloride infusion ( Intravenous Not Given 05/18/22 1925)  HYDROcodone-acetaminophen (NORCO/VICODIN) 5-325 MG per tablet 1 tablet (has no administration in time range)  sodium chloride 0.9 % bolus 1,000 mL (1,000 mLs Intravenous New Bag/Given 05/18/22 2022)    ED Course/ Medical Decision Making/ A&P                             Medical Decision Making Amount and/or Complexity of Data Reviewed Labs: ordered. Radiology: ordered.  Risk Prescription drug management.   This patient presents to the ED for concern of fall, this involves an extensive number of treatment options, and is a complaint that carries with it a high risk of complications and morbidity.  The differential diagnosis includes multiple trauma   Co morbidities that complicate the patient evaluation  Etoh abuse   Additional history obtained:  Additional history obtained from epic chart review External records from outside source obtained and reviewed including EMS report   Lab Tests:  I Ordered, and personally interpreted labs.  The pertinent results include:  cbc nl, cmp nl, etoh elevated at 196; acet 12, ck sl elevated at 403, but ua w/o myoglobinuria, uds neg   Imaging Studies  ordered:  I ordered imaging studies including cxr.  I reviewed wrist images. I independently visualized and interpreted imaging which showed  CXR: No evidence of acute cardiopulmonary disease.  I agree with the radiologist interpretation   Cardiac Monitoring:  The patient was maintained on a cardiac monitor.  I personally viewed and interpreted the cardiac monitored which showed an underlying rhythm of: st initially, but now nsr   Medicines ordered and prescription drug management:  I ordered medication including ivfs  for dehydration  Reevaluation of the patient after these medicines showed that the patient improved I have reviewed the patients home medicines and have made adjustments as needed   Test Considered:  xrays   Critical Interventions:  ivfs   Problem List / ED Course:  Alcohol abuse:  pt is able to ambulate.  He is not in w/d now Distal radius fx:  pt placed in a volar splint.  He is to f/u with ortho. No pcp/insurance/poor home situation:  sw consulted   Reevaluation:  After the interventions noted above, I reevaluated the patient and found that they have :improved   Social Determinants of Health:  Lives at home   Dispostion:  After consideration of the diagnostic results and the patients response to treatment, I feel that the patent would benefit from discharge with outpatient f/u.          Final Clinical Impression(s) / ED Diagnoses Final diagnoses:  Alcohol abuse  Other closed intra-articular fracture of distal end of left radius, initial encounter  Dehydration    Rx / DC Orders ED Discharge Orders          Ordered    HYDROcodone-acetaminophen (NORCO/VICODIN) 5-325 MG tablet  Every 4 hours PRN        05/18/22 2206    ibuprofen (ADVIL) 600 MG tablet  Every 6 hours PRN        05/18/22 2206              Isla Pence, MD 05/18/22 2206

## 2022-05-18 NOTE — ED Notes (Signed)
Pt came in soiled. The nurse and I asked three times if we could change him and he replied "Why? No, I don't need to be changed." Then we asked "So you do not want to be changed?" He said "NO!".

## 2022-05-18 NOTE — ED Triage Notes (Signed)
Pt here via ems after falling yesterday. Today pt with swelling and pain along with decreased ROM to L wrist/hand. Pt with ETOH on board. Cap refil >3 seconds.

## 2022-05-18 NOTE — ED Notes (Signed)
Pt to nurse's desk states he has called a ride and he is going to the lobby to wait for them.  Pt encouraged to wait in room for discharge instructions, pt states "He ain't gonna do nothing for me no way" and walks to lobby.  Pt AMA at this time.

## 2022-05-18 NOTE — ED Triage Notes (Signed)
Patient arrives via EMS for generalized body aches. No new injury per EMS or patient  Patient seen earlier today for a left wrist injury.

## 2022-05-19 NOTE — ED Provider Notes (Signed)
[facility] Provider Note   CSN: [id number] Arrival date & time: 05/18/22  1014     History  Chief Complaint  Patient presents with   Matthew Brady is a 62 y.o. male.  Patient fell today and complains of pain to his left arm.  He has a history of EtOH.  The history is provided by the patient and medical records. No language interpreter was used.  Fall This is a new problem. The current episode started 12 to 24 hours ago. The problem occurs constantly. The problem has not changed since onset.Pertinent negatives include no chest pain, no abdominal pain and no headaches. Nothing aggravates the symptoms. Nothing relieves the symptoms. He has tried nothing for the symptoms.       Home Medications Prior to Admission medications   Medication Sig Start Date End Date Taking? Authorizing Provider  acetaminophen (TYLENOL) 500 MG tablet Take 1 tablet (500 mg total) by mouth every 6 (six) hours as needed for mild pain (or Fever >/= 101). 11/09/16  Yes Cherene Altes, MD  chlordiazePOXIDE (LIBRIUM) 25 MG capsule Take one pill 2x a day for 3 days, then one pill a day for 3 days, then stop Patient not taking: Reported on 05/18/2022 11/09/16   Cherene Altes, MD  cloNIDine (CATAPRES) 0.1 MG tablet Take 1 tablet (0.1 mg total) by mouth 3 (three) times daily. Patient not taking: Reported on 05/18/2022 11/09/16   Cherene Altes, MD  HYDROcodone-acetaminophen (NORCO/VICODIN) 5-325 MG tablet Take 1 tablet by mouth every 4 (four) hours as needed. 05/18/22   Isla Pence, MD  ibuprofen (ADVIL) 600 MG tablet Take 1 tablet (600 mg total) by mouth every 6 (six) hours as needed. 05/18/22   Isla Pence, MD  Multiple Vitamin (MULTIVITAMIN WITH MINERALS) TABS tablet Take 1 tablet by mouth daily. Patient not taking: Reported on 05/18/2022 11/09/16   Cherene Altes, MD      Allergies    Patient has no known allergies.    Review of Systems    Review of Systems  Constitutional:  Negative for appetite change and fatigue.  HENT:  Negative for congestion, ear discharge and sinus pressure.   Eyes:  Negative for discharge.  Respiratory:  Negative for cough.   Cardiovascular:  Negative for chest pain.  Gastrointestinal:  Negative for abdominal pain and diarrhea.  Genitourinary:  Negative for frequency and hematuria.  Musculoskeletal:  Negative for back pain.       -Left arm  Skin:  Negative for rash.  Neurological:  Negative for seizures and headaches.  Psychiatric/Behavioral:  Negative for hallucinations.     Physical Exam Updated Vital Signs BP 129/83   Pulse 87   Temp 98.1 F (36.7 C)   Resp 18   SpO2 99%  Physical Exam Vitals and nursing note reviewed.  Constitutional:      Appearance: He is well-developed.  HENT:     Head: Normocephalic.     Nose: Nose normal.  Eyes:     General: No scleral icterus.    Conjunctiva/sclera: Conjunctivae normal.  Neck:     Thyroid: No thyromegaly.  Cardiovascular:     Rate and Rhythm: Normal rate and regular rhythm.     Heart sounds: No murmur heard.    No friction rub. No gallop.  Pulmonary:     Breath sounds: No stridor. No wheezing or rales.  Chest:     Chest wall: No tenderness.  Abdominal:     General: There is no distension.     Tenderness: There is no abdominal tenderness. There is no rebound.  Musculoskeletal:        General: Normal range of motion.     Cervical back: Neck supple.  Lymphadenopathy:     Cervical: No cervical adenopathy.  Skin:    Findings: No erythema or rash.  Neurological:     Mental Status: He is alert and oriented to person, place, and time.     Motor: No abnormal muscle tone.     Coordination: Coordination normal.  Psychiatric:        Behavior: Behavior normal.     ED Results / Procedures / Treatments   Labs (all labs ordered are listed, but only abnormal results are displayed) Labs Reviewed  CBC WITH DIFFERENTIAL/PLATELET -  Abnormal; Notable for the following components:      Result Value   WBC 10.6 (*)    RBC 4.00 (*)    MCV 102.8 (*)    MCH 34.3 (*)    Monocytes Absolute 2.0 (*)    All other components within normal limits  COMPREHENSIVE METABOLIC PANEL - Abnormal; Notable for the following components:   Calcium 8.5 (*)    All other components within normal limits  ETHANOL - Abnormal; Notable for the following components:   Alcohol, Ethyl (B) 240 (*)    All other components within normal limits    EKG None  Radiology DG Chest Portable 1 View  Result Date: 05/18/2022 CLINICAL DATA:  Fall EXAM: PORTABLE CHEST 1 VIEW COMPARISON:  11/06/2016 FINDINGS: Lungs are clear.  No pleural effusion or pneumothorax. The heart is normal in size. IMPRESSION: No evidence of acute cardiopulmonary disease. Electronically Signed   By: Koopmann Hy M.D.   On: 05/18/2022 18:29   DG Hand Complete Left  Result Date: 05/18/2022 CLINICAL DATA:  Pain and swelling after fall yesterday. EXAM: LEFT HAND - COMPLETE 3+ VIEW; LEFT WRIST - COMPLETE 3+ VIEW COMPARISON:  Left hand x-rays dated April 06, 2015. FINDINGS: Acute nondisplaced fracture of the distal radial metaphysis with intra-articular extension. No additional fracture. No dislocation. Diffuse soft tissue swelling of the wrist extending into the dorsal hand. Mild first CMC joint osteoarthritis. Remaining joint spaces are preserved. IMPRESSION: 1. Acute nondisplaced intra-articular fracture of the distal radius. Electronically Signed   By: Titus Dubin M.D.   On: 05/18/2022 10:53   DG Wrist Complete Left  Result Date: 05/18/2022 CLINICAL DATA:  Pain and swelling after fall yesterday. EXAM: LEFT HAND - COMPLETE 3+ VIEW; LEFT WRIST - COMPLETE 3+ VIEW COMPARISON:  Left hand x-rays dated April 06, 2015. FINDINGS: Acute nondisplaced fracture of the distal radial metaphysis with intra-articular extension. No additional fracture. No dislocation. Diffuse soft tissue swelling of  the wrist extending into the dorsal hand. Mild first CMC joint osteoarthritis. Remaining joint spaces are preserved. IMPRESSION: 1. Acute nondisplaced intra-articular fracture of the distal radius. Electronically Signed   By: Titus Dubin M.D.   On: 05/18/2022 10:53    Procedures Procedures    Medications Ordered in ED Medications  ibuprofen (ADVIL) tablet 800 mg (800 mg Oral Given 05/18/22 1156)    ED Course/ Medical Decision Making/ A&P                             Medical Decision Making Amount and/or Complexity of Data Reviewed Labs: ordered. Radiology: ordered.  Risk Prescription drug management.  Patient with alcohol abuse and distal radius fracture.  He is given a splint.  But he left AMA prior to discussion about disposition        Final Clinical Impression(s) / ED Diagnoses Final diagnoses:  None    Rx / DC Orders ED Discharge Orders     None         Milton Ferguson, MD 05/19/22 [phone]

## 2022-06-16 ENCOUNTER — Emergency Department (HOSPITAL_COMMUNITY): Payer: Self-pay

## 2022-06-16 ENCOUNTER — Encounter (HOSPITAL_COMMUNITY): Payer: Self-pay

## 2022-06-16 ENCOUNTER — Other Ambulatory Visit: Payer: Self-pay

## 2022-06-16 ENCOUNTER — Emergency Department (HOSPITAL_COMMUNITY)
Admission: EM | Admit: 2022-06-16 | Discharge: 2022-06-16 | Disposition: A | Discharge status: Home / Self Care | Payer: Self-pay | Attending: Emergency Medicine | Admitting: Emergency Medicine

## 2022-06-16 DIAGNOSIS — W19XXXA Unspecified fall, initial encounter: Secondary | ICD-10-CM | POA: Insufficient documentation

## 2022-06-16 DIAGNOSIS — M79602 Pain in left arm: Secondary | ICD-10-CM

## 2022-06-16 DIAGNOSIS — M79632 Pain in left forearm: Secondary | ICD-10-CM | POA: Insufficient documentation

## 2022-06-16 MED ORDER — IBUPROFEN 400 MG PO TABS
600.0000 mg | ORAL_TABLET | Freq: Once | ORAL | Status: AC
  Administered 2022-06-16: 600 mg via ORAL
  Filled 2022-06-16: qty 2

## 2022-06-16 NOTE — ED Notes (Addendum)
Pt has velco brace on left arm. Moves all extermities well and moves finger well.  Left finger sl. Swollen Pt intoxicated

## 2022-06-16 NOTE — ED Notes (Signed)
Pt walked out of room down hall to lobby- says he is leaving, PA aware.

## 2022-06-16 NOTE — ED Triage Notes (Signed)
Pt brought in by EMS from home with c/o left arm pain for weeks, EMS reports pt living with older male friend, pt living environment filthy with animal feces noted.  Pt currently has ETOH on board pt clothes and body with odor with hx of ETOH abuse. EMS reports pt friend told them pt broke arm several weeks ago, then took his cast off himself at home and has been hurting every since. Pt intoxicated, poor historian.

## 2022-06-16 NOTE — ED Provider Notes (Signed)
[facility] Provider Note   CSN: [id number] Arrival date & time: 06/16/22  1621     History {Add pertinent medical, surgical, social history, OB history to HPI:1} Chief Complaint  Patient presents with   Arm Pain   HPI Matthew Brady is a 62 y.o. male presenting for left arm pain.  Started a couple weeks ago.  Patient states he fell at that time and landed on his arm.  Denies any other injuries.  Patient is poor historian.  Appears to be intoxicated.  Patient locates the pain in the left forearm and says it hurts all up and down his forearm.  Patient reluctant to report whether or not it is impacting his grip strength or sensation in the lower or upper part of his arm.  States that he is moving his shoulder normally.  Patient was brought in by EMS.  EMS reports that patient's friend told him that he broke his arm several weeks ago and took his own cast off at home and has been hurting ever since.  EMS also mentioned a smell of alcohol on his breath.   Arm Pain       Home Medications Prior to Admission medications   Medication Sig Start Date End Date Taking? Authorizing Provider  acetaminophen (TYLENOL) 500 MG tablet Take 1 tablet (500 mg total) by mouth every 6 (six) hours as needed for mild pain (or Fever >/= 101). 11/09/16   Cherene Altes, MD  chlordiazePOXIDE (LIBRIUM) 25 MG capsule Take one pill 2x a day for 3 days, then one pill a day for 3 days, then stop Patient not taking: Reported on 05/18/2022 11/09/16   Cherene Altes, MD  cloNIDine (CATAPRES) 0.1 MG tablet Take 1 tablet (0.1 mg total) by mouth 3 (three) times daily. Patient not taking: Reported on 05/18/2022 11/09/16   Cherene Altes, MD  HYDROcodone-acetaminophen (NORCO/VICODIN) 5-325 MG tablet Take 1 tablet by mouth every 4 (four) hours as needed. 05/18/22   Isla Pence, MD  ibuprofen (ADVIL) 600 MG tablet Take 1 tablet (600 mg total) by mouth every 6 (six) hours as  needed. 05/18/22   Isla Pence, MD  Multiple Vitamin (MULTIVITAMIN WITH MINERALS) TABS tablet Take 1 tablet by mouth daily. Patient not taking: Reported on 05/18/2022 11/09/16   Cherene Altes, MD      Allergies    Patient has no known allergies.    Review of Systems   See HPI for pertinent positives   Physical Exam   Vitals:   06/16/22 1631 06/16/22 1634  BP: (!) 111/98   Pulse: 100   Resp: 20   Temp:  (!) 97.4 F (36.3 C)  SpO2: 97%     CONSTITUTIONAL:  well-appearing, NAD NEURO:  GCS 15. Speech is goal oriented. No deficits appreciated to CN III-XII; symmetric eyebrow raise, no facial drooping, tongue midline. Patient has equal grip strength bilaterally with 5/5 strength against resistance in all major muscle groups bilaterally. Sensation to light touch intact. Patient moves extremities without ataxia. Could not understand finger-nose-finger exercise. Patient ambulatory with steady gait.  EYES:  eyes equal and reactive ENT/NECK:  Supple, no stridor  CARDIO:  regular rate and rhythm, appears well-perfused  PULM:  No respiratory distress, CTAB GI/GU:  non-distended, soft MSK/SPINE:  No gross deformities, no edema, moves all extremities  SKIN:  no rash, atraumatic   *Additional and/or pertinent findings included in MDM below    ED Results / Procedures /  Treatments   Labs (all labs ordered are listed, but only abnormal results are displayed) Labs Reviewed - No data to display  EKG None  Radiology No results found.  Procedures Procedures  {Document cardiac monitor, telemetry assessment procedure when appropriate:1}  Medications Ordered in ED Medications - No data to display  ED Course/ Medical Decision Making/ A&P   {   Click here for ABCD2, HEART and other calculatorsREFRESH Note before signing :1}                          Medical Decision Making  .my  {Document critical care time when appropriate:1} {Document review of labs and clinical decision  tools ie heart score, Chads2Vasc2 etc:1}  {Document your independent review of radiology images, and any outside records:1} {Document your discussion with family members, caretakers, and with consultants:1} {Document social determinants of health affecting pt's care:1} {Document your decision making why or why not admission, treatments were needed:1} Final Clinical Impression(s) / ED Diagnoses Final diagnoses:  None    Rx / DC Orders ED Discharge Orders     None

## 2022-06-16 NOTE — ED Provider Notes (Signed)
Patient care signed out to me pending CT and plain film results.  Patient eloped out of the department prior to results of the imaging.   Sherrill Raring, PA-C 06/16/22 [place], DO 06/18/22 2043

## 2022-06-16 NOTE — ED Notes (Signed)
Patient transported to X-ray via w/c 

## 2022-06-16 NOTE — ED Notes (Signed)
Pt sleeping. 

## 2022-06-16 NOTE — ED Notes (Signed)
Pt sitting up in chair. Refuses to stay on strecther.   Becomes very loud and agitated if ask to stay on stretcher.

## 2022-06-16 NOTE — ED Notes (Signed)
Pt walks out of room, to nurses station, saying he wants to leave, redirected pt back to room, xray at bedside obtaining port xray of armQuentin Brady, [place] and Dr Earl Lites both aware that pt is stating he wants to leave and continues walking in and out of room.

## 2022-06-28 ENCOUNTER — Emergency Department (HOSPITAL_COMMUNITY)
Admission: EM | Admit: 2022-06-28 | Discharge: 2022-06-28 | Discharge status: Against Medical Advice | Payer: Self-pay | Attending: Emergency Medicine | Admitting: Emergency Medicine

## 2022-06-28 ENCOUNTER — Other Ambulatory Visit: Payer: Self-pay

## 2022-06-28 ENCOUNTER — Encounter (HOSPITAL_COMMUNITY): Payer: Self-pay | Admitting: Emergency Medicine

## 2022-06-28 DIAGNOSIS — Z5321 Procedure and treatment not carried out due to patient leaving prior to being seen by health care provider: Secondary | ICD-10-CM | POA: Insufficient documentation

## 2022-06-28 DIAGNOSIS — M79602 Pain in left arm: Secondary | ICD-10-CM | POA: Insufficient documentation

## 2022-06-28 MED ORDER — ACETAMINOPHEN 500 MG PO TABS: 1000.0000 mg | ORAL_TABLET | Freq: Once | ORAL | Status: DC

## 2022-06-28 NOTE — ED Triage Notes (Signed)
Pt BIB [facility] c/o left arm pain for the past several weeks. Pt recently seen for the same. EMS reports pt was ambulatory to truck with his cane. Pt obviously intoxicated at this time.

## 2022-07-21 ENCOUNTER — Other Ambulatory Visit: Payer: Self-pay

## 2022-07-21 ENCOUNTER — Emergency Department (HOSPITAL_COMMUNITY)
Admission: EM | Admit: 2022-07-21 | Discharge: 2022-07-21 | Disposition: A | Discharge status: Home / Self Care | Payer: Self-pay | Attending: Emergency Medicine | Admitting: Emergency Medicine

## 2022-07-21 ENCOUNTER — Encounter (HOSPITAL_COMMUNITY): Payer: Self-pay

## 2022-07-21 ENCOUNTER — Emergency Department (HOSPITAL_COMMUNITY): Payer: Self-pay

## 2022-07-21 DIAGNOSIS — Z23 Encounter for immunization: Secondary | ICD-10-CM | POA: Insufficient documentation

## 2022-07-21 DIAGNOSIS — S62102A Fracture of unspecified carpal bone, left wrist, initial encounter for closed fracture: Secondary | ICD-10-CM | POA: Insufficient documentation

## 2022-07-21 DIAGNOSIS — W19XXXA Unspecified fall, initial encounter: Secondary | ICD-10-CM | POA: Insufficient documentation

## 2022-07-21 DIAGNOSIS — S61012A Laceration without foreign body of left thumb without damage to nail, initial encounter: Secondary | ICD-10-CM | POA: Insufficient documentation

## 2022-07-21 MED ORDER — POVIDONE-IODINE 10 % EX SOLN
CUTANEOUS | Status: AC
  Filled 2022-07-21: qty 14.8

## 2022-07-21 MED ORDER — TETANUS-DIPHTH-ACELL PERTUSSIS 5-2.5-18.5 LF-MCG/0.5 IM SUSY
0.5000 mL | PREFILLED_SYRINGE | Freq: Once | INTRAMUSCULAR | Status: AC
  Administered 2022-07-21: 0.5 mL via INTRAMUSCULAR
  Filled 2022-07-21: qty 0.5

## 2022-07-21 NOTE — ED Triage Notes (Signed)
Pt BIB [facility]  for a laceration in the webbing between 1st and 2nd digit. Per EMS pt reported he had been drinking significant amount of ETOH today.

## 2022-07-21 NOTE — Discharge Instructions (Addendum)
Elevate your left hand when possible, keep it splinted.  Call the doctor listed to arrange a follow-up appointment for next week

## 2022-07-21 NOTE — ED Provider Notes (Signed)
[place] EMERGENCY DEPARTMENT AT [facility] Provider Note   CSN: [id number] Arrival date & time: 07/21/22  1826     History {Add pertinent medical, surgical, social history, OB history to HPI:1} Chief Complaint  Patient presents with   Laceration    Matthew Brady is a 62 y.o. male.   Laceration Associated symptoms: no fever        Matthew Brady is a 62 y.o. male with past medical history of chronic pain and alcohol abuse who presents to the Emergency Department via EMS for laceration to his left hand between the thumb and index finger.  He complains of pain of his hand with movement of his fingers.  States that his left hand and wrist are swollen.  He endorses a fall but cannot tell me when the fall occurred.  He is unsure when the laceration to his hand occurred.  Admits to drinking large amount of alcohol today.  Last Td is unknown.  Home Medications Prior to Admission medications   Medication Sig Start Date End Date Taking? Authorizing Provider  acetaminophen (TYLENOL) 500 MG tablet Take 1 tablet (500 mg total) by mouth every 6 (six) hours as needed for mild pain (or Fever >/= 101). 11/09/16   Lonia Blood, MD  HYDROcodone-acetaminophen (NORCO/VICODIN) 5-325 MG tablet Take 1 tablet by mouth every 4 (four) hours as needed. 05/18/22   Jacalyn Lefevre, MD  ibuprofen (ADVIL) 600 MG tablet Take 1 tablet (600 mg total) by mouth every 6 (six) hours as needed. 05/18/22   Jacalyn Lefevre, MD      Allergies    Patient has no known allergies.    Review of Systems   Review of Systems  Constitutional:  Negative for chills and fever.  Cardiovascular:  Negative for chest pain.  Gastrointestinal:  Negative for nausea and vomiting.  Musculoskeletal:  Positive for arthralgias (Pain and swelling left hand and wrist).  Skin:  Positive for wound (Laceration left hand between thumb and index finger). Negative for color change.  Neurological:  Negative for weakness and  numbness.    Physical Exam Updated Vital Signs BP (!) 146/109 (BP Location: Right Arm)   Pulse 95   Temp 98.7 F (37.1 C) (Oral)   Resp 18   Ht 5\' 11"  (1.803 m)   Wt 80 kg   SpO2 100%   BMI 24.60 kg/m  Physical Exam Vitals and nursing note reviewed.  Constitutional:      General: He is not in acute distress.    Appearance: Normal appearance. He is not toxic-appearing.     Comments: Patient appears unkept, smells of ETOH  Cardiovascular:     Rate and Rhythm: Normal rate and regular rhythm.     Pulses: Normal pulses.  Pulmonary:     Effort: Pulmonary effort is normal.  Musculoskeletal:        General: Swelling, tenderness and signs of injury present.     Comments: Diffuse tenderness to palpation the dorsal left hand and distal left wrist.  No bony deformities there is some mild to moderate swelling to the distal wrist, but I do not appreciate any edema of the hand.  Patient is able to move all the fingers of the left hand. 1 and half centimeter laceration near the base of the left thumb at the area of the webspace.  No foreign body seen.  Skin:    General: Skin is warm.     Capillary Refill: Capillary refill takes less than  2 seconds.     Findings: No erythema or rash.  Neurological:     Mental Status: He is alert.     ED Results / Procedures / Treatments   Labs (all labs ordered are listed, but only abnormal results are displayed) Labs Reviewed - No data to display  EKG None  Radiology No results found.  Procedures Procedures  {Document cardiac monitor, telemetry assessment procedure when appropriate:1}  Medications Ordered in ED Medications  Tdap (BOOSTRIX) injection 0.5 mL (has no administration in time range)    ED Course/ Medical Decision Making/ A&P   {   Click here for ABCD2, HEART and other calculatorsREFRESH Note before signing :1}                          Medical Decision Making Amount and/or Complexity of Data Reviewed Radiology:  ordered.  Risk Prescription drug management.     {Document critical care time when appropriate:1} {Document review of labs and clinical decision tools ie heart score, Chads2Vasc2 etc:1}  {Document your independent review of radiology images, and any outside records:1} {Document your discussion with family members, caretakers, and with consultants:1} {Document social determinants of health affecting pt's care:1} {Document your decision making why or why not admission, treatments were needed:1} Final Clinical Impression(s) / ED Diagnoses Final diagnoses:  None    Rx / DC Orders ED Discharge Orders     None

## 2022-07-21 NOTE — ED Notes (Signed)
Pt given sterile water and betadine to soak hand in. Has had to be redirected multiple times due to pt inability to stay still and keep hand in solution.
